# Patient Record
Sex: Male | Born: 1960 | Race: White | Hispanic: No | Marital: Single | Smoking: Current every day smoker
Health system: Southern US, Community
[De-identification: ages and names within clinical notes are randomized; demographics above are authoritative.]

## PROBLEM LIST (undated history)

## (undated) ENCOUNTER — Emergency Department (HOSPITAL_COMMUNITY): Admission: EM | Payer: Medicare Other | Source: Home / Self Care

## (undated) DIAGNOSIS — F209 Schizophrenia, unspecified: Secondary | ICD-10-CM

## (undated) DIAGNOSIS — R443 Hallucinations, unspecified: Secondary | ICD-10-CM

---

## 2017-09-06 ENCOUNTER — Encounter (HOSPITAL_COMMUNITY): Payer: Self-pay | Admitting: Emergency Medicine

## 2017-09-06 ENCOUNTER — Emergency Department (HOSPITAL_COMMUNITY)
Admission: EM | Admit: 2017-09-06 | Discharge: 2017-09-06 | Disposition: A | Discharge status: Home / Self Care | Payer: Medicare Other | Attending: Emergency Medicine | Admitting: Emergency Medicine

## 2017-09-06 ENCOUNTER — Other Ambulatory Visit: Payer: Self-pay

## 2017-09-06 ENCOUNTER — Emergency Department (HOSPITAL_COMMUNITY): Payer: Medicare Other

## 2017-09-06 DIAGNOSIS — Y9339 Activity, other involving climbing, rappelling and jumping off: Secondary | ICD-10-CM | POA: Diagnosis not present

## 2017-09-06 DIAGNOSIS — Z590 Homelessness: Secondary | ICD-10-CM | POA: Insufficient documentation

## 2017-09-06 DIAGNOSIS — W1789XA Other fall from one level to another, initial encounter: Secondary | ICD-10-CM | POA: Insufficient documentation

## 2017-09-06 DIAGNOSIS — F2089 Other schizophrenia: Secondary | ICD-10-CM | POA: Insufficient documentation

## 2017-09-06 DIAGNOSIS — Y998 Other external cause status: Secondary | ICD-10-CM | POA: Diagnosis not present

## 2017-09-06 DIAGNOSIS — F17210 Nicotine dependence, cigarettes, uncomplicated: Secondary | ICD-10-CM | POA: Insufficient documentation

## 2017-09-06 DIAGNOSIS — Z79899 Other long term (current) drug therapy: Secondary | ICD-10-CM

## 2017-09-06 DIAGNOSIS — Y929 Unspecified place or not applicable: Secondary | ICD-10-CM | POA: Diagnosis not present

## 2017-09-06 DIAGNOSIS — F22 Delusional disorders: Secondary | ICD-10-CM | POA: Diagnosis not present

## 2017-09-06 DIAGNOSIS — S32009A Unspecified fracture of unspecified lumbar vertebra, initial encounter for closed fracture: Secondary | ICD-10-CM | POA: Insufficient documentation

## 2017-09-06 DIAGNOSIS — S3992XA Unspecified injury of lower back, initial encounter: Secondary | ICD-10-CM | POA: Diagnosis present

## 2017-09-06 LAB — BASIC METABOLIC PANEL
Anion gap: 10 (ref 5–15)
BUN: 14 mg/dL (ref 6–20)
CO2: 22 mmol/L (ref 22–32)
Calcium: 8.9 mg/dL (ref 8.9–10.3)
Chloride: 103 mmol/L (ref 101–111)
Creatinine, Ser: 0.81 mg/dL (ref 0.61–1.24)
GFR calc Af Amer: >60 mL/min (ref >60)
GFR calc non Af Amer: >60 mL/min (ref >60)
Glucose, Bld: 92 mg/dL (ref 65–99)
Potassium: 4.5 mmol/L (ref 3.5–5.1)
Sodium: 135 mmol/L (ref 135–145)

## 2017-09-06 LAB — CBC
HEMATOCRIT: 43.2 % (ref 39.0–52.0)
HEMOGLOBIN: 14.5 g/dL (ref 13.0–17.0)
MCH: 30 pg (ref 26.0–34.0)
MCHC: 33.6 g/dL (ref 30.0–36.0)
MCV: 89.3 fL (ref 78.0–100.0)
Platelets: 257 10*3/uL (ref 150–400)
RBC: 4.84 MIL/uL (ref 4.22–5.81)
RDW: 14.4 % (ref 11.5–15.5)
WBC: 16.1 10*3/uL — ABNORMAL HIGH (ref 4.0–10.5)

## 2017-09-06 LAB — CBC WITH DIFFERENTIAL/PLATELET
Basophils Absolute: 0 10*3/uL (ref 0.0–0.1)
Basophils Relative: 0 %
Eosinophils Absolute: 0.1 10*3/uL (ref 0.0–0.7)
Eosinophils Relative: 0 %
HCT: 40.3 % (ref 39.0–52.0)
Hemoglobin: 13.6 g/dL (ref 13.0–17.0)
Lymphocytes Relative: 6 %
Lymphs Abs: 1.1 10*3/uL (ref 0.7–4.0)
MCH: 30.2 pg (ref 26.0–34.0)
MCHC: 33.7 g/dL (ref 30.0–36.0)
MCV: 89.4 fL (ref 78.0–100.0)
Monocytes Absolute: 1.2 10*3/uL — ABNORMAL HIGH (ref 0.1–1.0)
Monocytes Relative: 6 %
Neutro Abs: 16.7 10*3/uL — ABNORMAL HIGH (ref 1.7–7.7)
Neutrophils Relative %: 88 %
Platelets: 239 10*3/uL (ref 150–400)
RBC: 4.51 MIL/uL (ref 4.22–5.81)
RDW: 14.1 % (ref 11.5–15.5)
WBC: 19 10*3/uL — ABNORMAL HIGH (ref 4.0–10.5)

## 2017-09-06 MED ORDER — IBUPROFEN 600 MG PO TABS: 600.0000 mg | ORAL_TABLET | Freq: Four times a day (QID) | ORAL | 0 refills | Status: DC | PRN

## 2017-09-06 MED ORDER — MORPHINE SULFATE (PF) 4 MG/ML IV SOLN
4.0000 mg | Freq: Once | INTRAVENOUS | Status: AC
  Administered 2017-09-06: 4 mg via INTRAVENOUS
  Filled 2017-09-06: qty 1

## 2017-09-06 MED ORDER — OXYCODONE-ACETAMINOPHEN 5-325 MG PO TABS
1.0000 | ORAL_TABLET | Freq: Once | ORAL | Status: AC
  Administered 2017-09-06: 1 via ORAL
  Filled 2017-09-06: qty 1

## 2017-09-06 MED ORDER — OXYCODONE-ACETAMINOPHEN 5-325 MG PO TABS: 1.0000 | ORAL_TABLET | ORAL | 0 refills | Status: DC | PRN

## 2017-09-06 MED ORDER — IOPAMIDOL (ISOVUE-300) INJECTION 61%
INTRAVENOUS | Status: AC
  Administered 2017-09-06: 100 mL
  Filled 2017-09-06: qty 100

## 2017-09-06 MED ORDER — SODIUM CHLORIDE 0.9 % IV BOLUS (SEPSIS)
1000.0000 mL | Freq: Once | INTRAVENOUS | Status: AC
Start: 2017-09-06 — End: 2017-09-06
  Administered 2017-09-06: 1000 mL via INTRAVENOUS

## 2017-09-06 MED ORDER — IBUPROFEN 400 MG PO TABS: 600.0000 mg | ORAL_TABLET | Freq: Once | ORAL | Status: DC

## 2017-09-06 NOTE — ED Notes (Signed)
Patient transported to CT 

## 2017-09-06 NOTE — ED Triage Notes (Signed)
Patient states he was sleeping on a roof and was awoken and when he went to get off slipped and fell approx 15 feet to the ground below.

## 2017-09-06 NOTE — ED Triage Notes (Signed)
Reports having psych issues in the past.  Currently feeling anxious, depressed, and having delusions that he is God.  States I need some help.  Denies any SI or HI.
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
## 2017-09-06 NOTE — ED Notes (Signed)
Pt inquiring about detox. States has "nowhere to go"  Tearful.  "feels hopeless"

## 2017-09-07 ENCOUNTER — Emergency Department (HOSPITAL_COMMUNITY)
Admission: EM | Admit: 2017-09-07 | Discharge: 2017-09-07 | Disposition: A | Discharge status: Home / Self Care | Payer: Medicare Other | Source: Home / Self Care | Attending: Emergency Medicine | Admitting: Emergency Medicine

## 2017-09-07 ENCOUNTER — Encounter (HOSPITAL_COMMUNITY): Payer: Self-pay | Admitting: Emergency Medicine

## 2017-09-07 ENCOUNTER — Emergency Department (HOSPITAL_COMMUNITY)
Admission: EM | Admit: 2017-09-07 | Discharge: 2017-09-09 | Disposition: A | Discharge status: Other Acute Inpatient Hospital | Payer: Medicare Other | Source: Home / Self Care | Attending: Emergency Medicine | Admitting: Emergency Medicine

## 2017-09-07 DIAGNOSIS — F17210 Nicotine dependence, cigarettes, uncomplicated: Secondary | ICD-10-CM

## 2017-09-07 DIAGNOSIS — F22 Delusional disorders: Secondary | ICD-10-CM | POA: Insufficient documentation

## 2017-09-07 DIAGNOSIS — F2089 Other schizophrenia: Secondary | ICD-10-CM

## 2017-09-07 DIAGNOSIS — Z79899 Other long term (current) drug therapy: Secondary | ICD-10-CM | POA: Insufficient documentation

## 2017-09-07 DIAGNOSIS — Z760 Encounter for issue of repeat prescription: Secondary | ICD-10-CM

## 2017-09-07 DIAGNOSIS — S32009A Unspecified fracture of unspecified lumbar vertebra, initial encounter for closed fracture: Secondary | ICD-10-CM | POA: Diagnosis not present

## 2017-09-07 HISTORY — DX: Hallucinations, unspecified: R44.3

## 2017-09-07 HISTORY — DX: Schizophrenia, unspecified: F20.9

## 2017-09-07 LAB — COMPREHENSIVE METABOLIC PANEL
ALBUMIN: 4 g/dL (ref 3.5–5.0)
ALK PHOS: 70 U/L (ref 38–126)
ALT: 19 U/L (ref 17–63)
AST: 25 U/L (ref 15–41)
Anion gap: 11 (ref 5–15)
BILIRUBIN TOTAL: 1.1 mg/dL (ref 0.3–1.2)
BUN: 11 mg/dL (ref 6–20)
CALCIUM: 9 mg/dL (ref 8.9–10.3)
CO2: 20 mmol/L — ABNORMAL LOW (ref 22–32)
Chloride: 105 mmol/L (ref 101–111)
Creatinine, Ser: 0.8 mg/dL (ref 0.61–1.24)
GFR calc Af Amer: >60 mL/min (ref >60)
GFR calc non Af Amer: >60 mL/min (ref >60)
GLUCOSE: 121 mg/dL — AB (ref 65–99)
POTASSIUM: 3.9 mmol/L (ref 3.5–5.1)
Sodium: 136 mmol/L (ref 135–145)
TOTAL PROTEIN: 7.1 g/dL (ref 6.5–8.1)

## 2017-09-07 LAB — RAPID URINE DRUG SCREEN, HOSP PERFORMED
Amphetamines: NOT DETECTED
BARBITURATES: NOT DETECTED
BENZODIAZEPINES: NOT DETECTED
Cocaine: POSITIVE — AB
Opiates: POSITIVE — AB
Tetrahydrocannabinol: NOT DETECTED

## 2017-09-07 LAB — ETHANOL: Alcohol, Ethyl (B): <10 mg/dL (ref <10)

## 2017-09-07 MED ORDER — IBUPROFEN 600 MG PO TABS: 600.0000 mg | ORAL_TABLET | Freq: Four times a day (QID) | ORAL | 0 refills | Status: DC | PRN

## 2017-09-07 MED ORDER — LORAZEPAM 2 MG/ML IJ SOLN: 0.0000 mg | Freq: Four times a day (QID) | INTRAMUSCULAR | Status: DC

## 2017-09-07 MED ORDER — VITAMIN B-1 100 MG PO TABS
100.0000 mg | ORAL_TABLET | Freq: Every day | ORAL | Status: DC
  Administered 2017-09-07 – 2017-09-09 (×3): 100 mg via ORAL
  Filled 2017-09-07 (×3): qty 1

## 2017-09-07 MED ORDER — ALUM & MAG HYDROXIDE-SIMETH 200-200-20 MG/5ML PO SUSP: 30.0000 mL | Freq: Four times a day (QID) | ORAL | Status: DC | PRN

## 2017-09-07 MED ORDER — OLANZAPINE 7.5 MG PO TABS
7.5000 mg | ORAL_TABLET | Freq: Every day | ORAL | Status: DC
  Administered 2017-09-07: 7.5 mg via ORAL

## 2017-09-07 MED ORDER — IBUPROFEN 600 MG PO TABS
600.0000 mg | ORAL_TABLET | Freq: Four times a day (QID) | ORAL | Status: DC | PRN
  Filled 2017-09-07 (×2): qty 12

## 2017-09-07 MED ORDER — THIAMINE HCL 100 MG/ML IJ SOLN: 100.0000 mg | Freq: Every day | INTRAMUSCULAR | Status: DC

## 2017-09-07 MED ORDER — IBUPROFEN 400 MG PO TABS
600.0000 mg | ORAL_TABLET | Freq: Three times a day (TID) | ORAL | Status: DC | PRN
  Administered 2017-09-08 – 2017-09-09 (×4): 600 mg via ORAL
  Filled 2017-09-07 (×4): qty 1

## 2017-09-07 MED ORDER — OLANZAPINE 7.5 MG PO TABS: 7.5000 mg | ORAL_TABLET | Freq: Every day | ORAL | 0 refills | Status: DC

## 2017-09-07 MED ORDER — LORAZEPAM 1 MG PO TABS: 0.0000 mg | ORAL_TABLET | Freq: Two times a day (BID) | ORAL | Status: DC

## 2017-09-07 MED ORDER — IBUPROFEN 400 MG PO TABS: 600.0000 mg | ORAL_TABLET | Freq: Four times a day (QID) | ORAL | Status: DC | PRN

## 2017-09-07 MED ORDER — OLANZAPINE 7.5 MG PO TABS
7.5000 mg | ORAL_TABLET | Freq: Every day | ORAL | Status: DC
  Filled 2017-09-07: qty 1

## 2017-09-07 MED ORDER — LORAZEPAM 2 MG/ML IJ SOLN: 0.0000 mg | Freq: Two times a day (BID) | INTRAMUSCULAR | Status: DC

## 2017-09-07 MED ORDER — ONDANSETRON HCL 4 MG PO TABS: 4.0000 mg | ORAL_TABLET | Freq: Three times a day (TID) | ORAL | Status: DC | PRN

## 2017-09-07 MED ORDER — OLANZAPINE 7.5 MG PO TABS
7.5000 mg | ORAL_TABLET | Freq: Every day | ORAL | Status: DC
  Filled 2017-09-07: qty 3

## 2017-09-07 MED ORDER — LORAZEPAM 1 MG PO TABS
0.0000 mg | ORAL_TABLET | Freq: Four times a day (QID) | ORAL | Status: DC
  Administered 2017-09-07: 2 mg via ORAL
  Administered 2017-09-09: 1 mg via ORAL
  Filled 2017-09-07: qty 1
  Filled 2017-09-07: qty 2

## 2017-09-07 MED ORDER — OLANZAPINE 5 MG PO TABS
7.5000 mg | ORAL_TABLET | Freq: Every day | ORAL | Status: DC
  Administered 2017-09-07 – 2017-09-09 (×3): 7.5 mg via ORAL
  Filled 2017-09-07: qty 2
  Filled 2017-09-07: qty 1
  Filled 2017-09-07: qty 2

## 2017-09-07 MED ORDER — OLANZAPINE 7.5 MG PO TABS: 7.5000 mg | ORAL_TABLET | Freq: Every day | ORAL | Status: DC
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
## 2017-09-07 NOTE — ED Notes (Signed)
Patient able to ambulate independently  

## 2017-09-07 NOTE — ED Notes (Signed)
Patient ambulated independently multiple times to restroom and in hallways without complaint.  Patient provided with supplied medications from Pharmacy.  Patient now stating he does not want to leave and "can't I just wait in here for a while".  This RN explained plan from social work and patient verbalized understanding but still stating he does not want to leave.  When this RN went to walk patient out, patient began stating his back was spasming and he could not leave.  This RN accompanied patient in a wheelchair to lobby, [facility]  Patient stated he was unhappy with having to leave.  This Programmer, multimediaN apologized.  Will d/c.

## 2017-09-07 NOTE — Discharge Instructions (Signed)
Substance Abuse Treatment Programs ° °Intensive Outpatient Programs °[facility]     °[address]      °[place], [place]                   °[phone]      ° °[facility] °[address] °[place], [place] °336-379-7146 ° °[facility]     °(Inpatient and outpatient)     °[address]           °336-832-9800   ° °[facility] °336-288-1484 (Suboxone and Methadone) ° °[address]      °[place], [place] [address]      °336-882-2125      ° °[address] °[place], [place] °852-3033 ° °[facility] (Outpatient/Inpatient, [facility])    °(insurance only) [phone]      °       °[facility] (Groups & Residential) °[place], [place] °336-389-1413 ° °   °[facility]     °[address]     °[place], [place]      °336-389-1413      ° °[facility] (for caregivers and family) °[address] °[place], [place] °336-299-4655 ° ° ° ° ° °Residential Treatment Programs °[facility]      °[address], [place], [place] [address]  °([phone]      ° °[facility] °[address] [place], [place] [address] °919-419-1059 ° °[facility]        °336-248-8914      ° °[facility] °1-800-659-3381 ° °[facility]             °[address]                                         °[place], [place]                                                °877-615-2722 or 336-784-9470                              ° °[facility] [place] °[address] °[place], [address] °1.877.941.8954 ° °[facility]    °[address]      °, [place]     °336-273-5306      ° °[facility] °[address] °, [place] °336-285-9073 ° °[facility]   °[address]     °[place], [place] [address]     °336-899-1550      °Admissions: 8am-3pm M-F ° °[facility] °[address] °[place],  [place] °336-227-7417 ° °[facility]: Residential Program (90 Days)   °[place], [place]      °336-725-8389 or 800-758-6077    ° °[facility] °[place], [place] °(Walk in Hours over the weekend or by referral) ° °[facility] °[address], [place],  [address] °(336) [phone] ° °Crisis Mobile: [facility]:  1-877-626-1772 (for crisis response 24 hours a day) °[facility] Hotline:      1-800-256-2452 °Outpatient Psychiatry and Counseling ° °[facility]: Mobile Crisis   Management 24 hours:  1-877-626-1772 ° °[facility] sliding scale fee and walk in schedule: M-F 8am-12pm/1pm-3pm °[address]  °[place], [place] [address] °336-387-6161 ° °[facility] °[address] °[place], [place] [address] °336-703-9650 ° °[facility] ([facility])- new patient walk-in appointments available Monday - Friday 8am -3pm.          °[address] °[place], [place] [address] °336-676-6840 or crisis line- 336-676-6905 ° °[facility]/ Intensive Outpatient Therapy Program °[address] °[place], [place] [address] °336-832-9804 ° °[facility]                  °Crisis Services      °336.641.4993      °[address]     °[place], [place] [address]                ° °[facility]   °[facility] °[phone] °[address] °[place], [place] [address] ° ° °[facility]          °[address]  °[place], [place] [address]       °(336) [phone] ° °[facility] °[address] °[place], [place] [address] °336-292-1510 ° °[facility]    °[address]    °[place], [place] [address]     °336-632-3505      ° °Parish McKinney, MD     °[address]     °[place] [address]     °336-282-1251     °  °[facility] °[address] °[place] [address] ° °[facility]     °[address]     °[place], [place]      °336-542-2076      ° °[facility] °Shamsher Ahluwalia, MD °[address] °[place], [place] [address] °336-420-9558 ° °[facility]     °[address]     °[place], [place] [address]     °336-274-1237      ° °[facility] °[address] °[place], [place] [address] °336-854-4450 °Resources for Temporary Residential Assistance/Crisis Centers ° °DAY CENTERS °[facility] °M-F 8am-3pm   °[address] [place], [place] [address]   336-332-0824 °Services include: laundry, barbering, support groups, case management, phone  & computer access, showers, AA/NA mtgs, mental health/substance abuse nurse, job skills class, disability information, VA assistance, spiritual classes, etc.  ° °HOMELESS SHELTERS ° °[facility]     °[facility]   °[address], [place]     °336.271.5959       °       °[facility] (women and children)       °[address] °[place], [place] [address] °336-275-0820 °[email or web address] for application and process °Application Required ° °[facility]   °[address]    °[place] [address]     °[phone]       °             °[facility] °[address] °[place], [place] [address] °336.273.5572 °336-235-0363(schedule application appt.) °Application Required ° °[facility] (women only)    °[address]     °[place], [place] [address]     °336-884-1039      °  Intake starts 6pm daily °Need valid ID, SSC, & Police report °[facility] [place] °[address] °[place], [place] °336-881-5420 °Application Required ° °[facility] (men only)     °[address]      °[place], [place]     °[phone]      ° °[facility] °(Pregnant women only) °[address] °[place], [place] °336-275-0206 ° °[facility]      °[address]      °[place], [place] [address]     °336-722-9951      °       °[facility] °[address] °[place], [place] °336-723-1848 °90 day commitment/SA/Application process ° °[facility](men only)     °[address]     °[place], [place]     °336-748-1962       °Check-in at 7pm     °       °[facility] °[address] °[place], [place] [address] °336-248-6684 °Men/Women/Women and Children must be there by 7 pm ° °[facility] °[place], [place] °336-722-8721                ° °

## 2017-09-07 NOTE — ED Provider Notes (Signed)
[facility] EMERGENCY DEPARTMENT Provider Note   CSN: [id number] Arrival date & time: 09/06/17  2319     History   Chief Complaint Chief Complaint  Patient presents with  . Delusional  . Psychiatric Evaluation    HPI Charles Page is a 57 y.o. male.  HPI  This is a 57 year old male with a history of schizophrenia who presents requesting his medications.  Patient states that he was seen here yesterday for a fall.  He was discharged with prescriptions for medications but he is homeless and lives in [place].  He has been sitting in the waiting room.  He has no way of getting his medications.  He has been out of his olanzapine for 3 weeks.  He denies any HI, SI, hallucinations, delusions.  He denies any physical complaints.  Past Medical History:  Diagnosis Date  . Hallucination   . Schizophrenia (HCC)     There are no active problems to display for this patient.   History reviewed. No pertinent surgical history.     Home Medications    Prior to Admission medications   Medication Sig Start Date End Date Taking? Authorizing Provider  ibuprofen (ADVIL,MOTRIN) 600 MG tablet Take 1 tablet (600 mg total) by mouth every 6 (six) hours as needed. 09/06/17   Raeford Razor, MD  oxyCODONE-acetaminophen (PERCOCET/ROXICET) 5-325 MG tablet Take 1-2 tablets by mouth every 4 (four) hours as needed for severe pain. 09/06/17   Raeford Razor, MD    Family History No family history on file.  Social History Social History   Tobacco Use  . Smoking status: Current Every Day Smoker    Types: Cigarettes  . Smokeless tobacco: Never Used  Substance Use Topics  . Alcohol use: No    Frequency: Never  . Drug use: Yes    Types: Cocaine, Marijuana    Comment: used cocaine 2 days ago     Allergies   Patient has no known allergies.   Review of Systems Review of Systems  Constitutional: Negative for fever.  Respiratory: Negative for shortness of breath.     Cardiovascular: Negative for chest pain.  Psychiatric/Behavioral: Negative for hallucinations and suicidal ideas. The patient is not hyperactive.   All other systems reviewed and are negative.    Physical Exam Updated Vital Signs BP 139/88 (BP Location: Right Arm)   Pulse 100   Temp 98.8 F (37.1 C) (Oral)   Resp 18   Ht 5\' 11"  (1.803 m)   Wt 71.2 kg (157 lb)   SpO2 92%   BMI 21.90 kg/m   Physical Exam  Constitutional: He is oriented to person, place, and time.  Disheveled appearing, no acute distress  HENT:  Head: Normocephalic and atraumatic.  Cardiovascular: Normal rate and regular rhythm.  Pulmonary/Chest: Effort normal. No respiratory distress.  Musculoskeletal: He exhibits no edema.  Neurological: He is alert and oriented to person, place, and time.  Skin: Skin is warm and dry.  Psychiatric:  Pleasant but paranoid  Nursing note and vitals reviewed.    ED Treatments / Results  Labs (all labs ordered are listed, but only abnormal results are displayed) Labs Reviewed  COMPREHENSIVE METABOLIC PANEL - Abnormal; Notable for the following components:      Result Value   CO2 20 (*)    Glucose, Bld 121 (*)    All other components within normal limits  CBC - Abnormal; Notable for the following components:   WBC 16.1 (*)    All  other components within normal limits  RAPID URINE DRUG SCREEN, HOSP PERFORMED - Abnormal; Notable for the following components:   Opiates POSITIVE (*)    Cocaine POSITIVE (*)    All other components within normal limits  ETHANOL    EKG  EKG Interpretation None       Radiology Ct Abdomen Pelvis W Contrast  Result Date: 09/06/2017 CLINICAL DATA:  15 feet fall EXAM: CT ABDOMEN AND PELVIS WITH CONTRAST TECHNIQUE: Multidetector CT imaging of the abdomen and pelvis was performed using the standard protocol following bolus administration of intravenous contrast. CONTRAST:  100mL ISOVUE-300 IOPAMIDOL (ISOVUE-300) INJECTION 61% COMPARISON:   None. FINDINGS: Lower chest: Lung bases demonstrate moderate emphysema. Patchy consolidation at the medial posterior right lung base. No pleural effusion or pneumothorax at the lung base. Heart size within normal limits. Hepatobiliary: No focal hepatic abnormality. No calcified gallstones or biliary dilatation. Pancreas: Unremarkable. No pancreatic ductal dilatation or surrounding inflammatory changes. Spleen: No splenic injury or perisplenic hematoma. Adrenals/Urinary Tract: No adrenal hemorrhage or renal injury identified. Bladder is unremarkable. Stomach/Bowel: Stomach is within normal limits. Appendix appears normal. No evidence of bowel wall thickening, distention, or inflammatory changes. Vascular/Lymphatic: Nonaneurysmal aorta. Mild to moderate aortic atherosclerosis. No significantly enlarged lymph nodes. Reproductive: Slightly enlarged prostate with calcification Other: Negative for free air or significant free fluid. Musculoskeletal: Grade 1 anterolisthesis of L5 on S1. Vertebral body heights are within normal limits. Possible subtle right transverse process fracture at L3. IMPRESSION: 1. No CT evidence for acute solid organ injury within the abdomen or pelvis 2. Patchy consolidation in the medial posterior right lung base may reflect atelectasis, aspiration or minimal contusion 3. Possible nondisplaced right transverse process fracture at L3 Electronically Signed   By: Jasmine PangKim  Fujinaga M.D.   On: 09/06/2017 19:48    Procedures Procedures (including critical care time)  Medications Ordered in ED Medications - No data to display   Initial Impression / Assessment and Plan / ED Course  I have reviewed the triage vital signs and the nursing notes.  Pertinent labs & imaging results that were available during my care of the patient were reviewed by me and considered in my medical decision making (see chart for details).    Patient presents requesting help getting his medications.  He is homeless.   He was discharged to the waiting room but signed back and.  I feel he will likely do the same if I discharge him without help with medications and transport.  Will have case management evaluate him in the morning.  Final Clinical Impressions(s) / ED Diagnoses   Final diagnoses:  Other schizophrenia [facility](HCC)  Medication refill    ED Discharge Orders    None       Shon BatonHorton, Courtney F, MD 09/07/17 [phone]

## 2017-09-07 NOTE — ED Notes (Signed)
Discussed plan of care with patient including medications and discharge plans. Patient agreeable.

## 2017-09-07 NOTE — Progress Notes (Signed)
CSW met with patient at bedside to discuss issues with discharge. Patient is homeless, needs psychotropic medications, and lacks support system. CSW and patient discussed current issues. CSW called to various shelters in attempt to secure a place for the night, [employer] is the most likely option and was told by Best boy to call back after 3:30pm to determine whether or not he would have a place. CSW gave information for [facility], shelters, and [employer], [place] bus route information, and gave patient two bus passes. Caryl Pina, RN CM is helping patient with his medication. Patient in obvious need of medication for his mental health.  Madilyn Fireman, MSW, LCSW-A Weekend Clinical Social Worker [phone]

## 2017-09-07 NOTE — ED Triage Notes (Signed)
Pt returning to ER due to be delusional. Was in the waiting area stating he was jesus. Pt not clear on why he is here.

## 2017-09-07 NOTE — Care Management Note (Addendum)
Case Management Note  Patient Details  Name: Charles Page MRN: [id number] Date of Birth: 01/05/1961  Subjective/Objective:      Consult received bc pt had visit to ED yesterday and returned today because he had nowhere to go and could not get prescriptions filled for Zyprexa, Advil and Percocet.  Pt is schizophrenic and has Medicaid and Medicare A/B.  Pt states his daughter, Charles Page, usually helps him with prescriptions but he cannot get in touch with her and she has no transportation.  Pt states he lives in a shed in [place] but would like to go to a shelter.  He has no money to get prescriptions filled.  Advil and Percocet are for back pain, which was his complaint yesterday.  Pt is ambulating without problem in room and does not appear to be in a great deal of pain.  Pt states he can walk to a bus stop if given bus passes.  Action/Plan: Pt supplied phone number that he thinks is daughter's.  Left VM for daughter.  Pt also had a business card for Charles Page, Peer support from mental health facility ([facility]) in [place].  Left VM on cell phone for Charles Page.    Consulted with CSW, Charles Page, who arranged for bus passes and for pt to go to shelter.  Pt provided 3 [facility] of [facility] and [facility].  ED staff to pick up from pharmacy.            Expected Discharge Date:   09/07/17               Expected Discharge Plan:    to shelter  In-House Referral:    CSW  Discharge planning Services   CM- medication management  Post Acute Care Choice:   N/A Choice offered to:     DME Arranged:    DME Agency:     HH Arranged:    HH Agency:     Status of Service:     If discussed at [employer] Meetings, dates discussed:    Additional Comments:  Addendum: Charles Page from [facility] returned call and said patient had come there once and not returned.  Charles Page is willing to help patient with housing and medications if pt can return there.  Charles Page [facility].    Charles FurlongAshley  Charles Lansdale, RN 09/07/2017, 10:14 AM

## 2017-09-07 NOTE — ED Notes (Addendum)
Pt rambling about family problems, being homeless, not having psych meds, not having a place to sleep, not being able to sleep, not having clothes. Pt denies SI/HI. Pt asking about auditory or visual hallucinations and denies. Pt answers questions after being asked questions multiple times. Pt reports hx of traumatic head injury. Pt requesting to be in the "psych ward" so he can get back on his meds.

## 2017-09-07 NOTE — ED Notes (Signed)
Pt had lab work completed yesterday.

## 2017-09-07 NOTE — ED Provider Notes (Signed)
Blood pressure 111/67, pulse 76, temperature 98.8 F (37.1 C), temperature source Oral, resp. rate 16, height 5\' 11"  (1.803 m), weight 71.2 kg (157 lb), SpO2 96 %.  Assuming care from Dr. Wilkie AyeHorton.  In short, Parthenia Ameserry Storer is a 57 y.o. male with a chief complaint of Delusional and Psychiatric Evaluation .  Refer to the original H&P for additional details.  The current plan of care is to f/u with social work for med assistance.  09:54 AM Social work has evaluated the patient.  I will provide prescriptions for the patient's Zyprexa 7.5 mg daily along with Advil.  I also provided resource information for behavioral health to pursue as an outpatient.  At this time, I do not feel there is any life-threatening condition present. I have reviewed and discussed all results (EKG, imaging, lab, urine as appropriate), exam findings with patient. I have reviewed nursing notes and appropriate previous records.  I feel the patient is safe to be discharged home without further emergent workup. Discussed usual and customary return precautions. Patient and family (if present) verbalize understanding and are comfortable with this plan.  Patient will follow-up with their primary care provider. If they do not have a primary care provider, information for follow-up has been provided to them. All questions have been answered.  Alona BeneJoshua Doloris Servantes, MD     Maia PlanLong, Rayelle Armor G, MD 09/07/17 [phone]

## 2017-09-07 NOTE — ED Notes (Signed)
Pt taking shower at this time  

## 2017-09-07 NOTE — ED Provider Notes (Signed)
[facility] Provider Note   CSN: [id number] Arrival date & time: 09/07/17  1613     History   Chief Complaint Chief Complaint  Patient presents with  . Delusional    HPI Parthenia Ameserry Thomann is a 57 y.o. male.  HPI  Patient with a history of hallucinations and schizophrenia presents after recent discharge earlier today saying he is Jesus.  When asked what happened after he left the ED he states he walked around and then states he is not sure why he is here.  In the waiting room he was stating that he thought he was Jesus.  Prior to discharge today he was seen by social work and provided with 3 days worth of his medications as well as prescriptions and bus passes and plan for shelter tonight.    Past Medical History:  Diagnosis Date  . Hallucination   . Schizophrenia (HCC)     There are no active problems to display for this patient.   History reviewed. No pertinent surgical history.     Home Medications    Prior to Admission medications   Medication Sig Start Date End Date Taking? Authorizing Provider  ibuprofen (ADVIL,MOTRIN) 600 MG tablet Take 1 tablet (600 mg total) by mouth every 6 (six) hours as needed for up to 3 days. 09/07/17 09/10/17 Yes Long, Arlyss RepressJoshua G, MD  OLANZapine (ZYPREXA) 7.5 MG tablet Take 1 tablet (7.5 mg total) by mouth daily for 3 days. 09/07/17 09/10/17  Long, Arlyss RepressJoshua G, MD  OLANZapine (ZYPREXA) 7.5 MG tablet Take 1 tablet (7.5 mg total) by mouth daily. 09/07/17 10/07/17  Long, Arlyss RepressJoshua G, MD  oxyCODONE-acetaminophen (PERCOCET/ROXICET) 5-325 MG tablet Take 1-2 tablets by mouth every 4 (four) hours as needed for severe pain. Patient not taking: Reported on 09/07/2017 09/06/17   Raeford RazorKohut, Stephen, MD    Family History History reviewed. No pertinent family history.  Social History Social History   Tobacco Use  . Smoking status: Current Every Day Smoker    Types: Cigarettes  . Smokeless tobacco: Never Used  Substance Use Topics   . Alcohol use: No    Frequency: Never  . Drug use: Yes    Types: Cocaine, Marijuana    Comment: used cocaine 2 days ago     Allergies   Patient has no known allergies.   Review of Systems Review of Systems  ROS reviewed and all otherwise negative except for mentioned in HPI   Physical Exam Updated Vital Signs BP 112/60   Pulse 67   Temp 97.8 F (36.6 C) (Oral)   Resp 16   SpO2 99%  Vitals reviewed Physical Exam  Physical Examination: General appearance - alert, disheveled appearing, and in no distress Mental status - alert, oriented to person, Eyes - pupils equal and reactive, extraocular eye movements intact Mouth - mucous membranes moist, pharynx normal without lesions Chest - clear to auscultation, no wheezes, rales or rhonchi, symmetric air entry Heart - normal rate, regular rhythm, normal S1, S2, no murmurs, rubs, clicks or gallops Abdomen - soft, nontender, nondistended, no masses or organomegaly Neurological - alert, oriented, normal speech, strength 5/5 in extremities x 4, normal gait Extremities - peripheral pulses normal, no pedal edema, no clubbing or cyanosis Skin - normal coloration and turgor, no rashes,  Psych- delusions, calm and cooperative   ED Treatments / Results  Labs (all labs ordered are listed, but only abnormal results are displayed) Labs Reviewed - No data to display  EKG  EKG Interpretation None       Radiology Ct Abdomen Pelvis W Contrast  Result Date: 09/06/2017 CLINICAL DATA:  15 feet fall EXAM: CT ABDOMEN AND PELVIS WITH CONTRAST TECHNIQUE: Multidetector CT imaging of the abdomen and pelvis was performed using the standard protocol following bolus administration of intravenous contrast. CONTRAST:  ISOVUE-300 IOPAMIDOL (ISOVUE-300) INJECTION 61% COMPARISON:  None. FINDINGS: Lower chest: Lung bases demonstrate moderate emphysema. Patchy consolidation at the medial posterior right lung base. No pleural effusion or pneumothorax  at the lung base. Heart size within normal limits. Hepatobiliary: No focal hepatic abnormality. No calcified gallstones or biliary dilatation. Pancreas: Unremarkable. No pancreatic ductal dilatation or surrounding inflammatory changes. Spleen: No splenic injury or perisplenic hematoma. Adrenals/Urinary Tract: No adrenal hemorrhage or renal injury identified. Bladder is unremarkable. Stomach/Bowel: Stomach is within normal limits. Appendix appears normal. No evidence of bowel wall thickening, distention, or inflammatory changes. Vascular/Lymphatic: Nonaneurysmal aorta. Mild to moderate aortic atherosclerosis. No significantly enlarged lymph nodes. Reproductive: Slightly enlarged prostate with calcification Other: Negative for free air or significant free fluid. Musculoskeletal: Grade 1 anterolisthesis of L5 on S1. Vertebral body heights are within normal limits. Possible subtle right transverse process fracture at L3. IMPRESSION: 1. No CT evidence for acute solid organ injury within the abdomen or pelvis 2. Patchy consolidation in the medial posterior right lung base may reflect atelectasis, aspiration or minimal contusion 3. Possible nondisplaced right transverse process fracture at L3 Electronically Signed   By: Jasmine Pang M.D.   On: 09/06/2017 19:48    Procedures Procedures (including critical care time)  Medications Ordered in ED Medications - No data to display   Initial Impression / Assessment and Plan / ED Course  I have reviewed the triage vital signs and the nursing notes.  Pertinent labs & imaging results that were available during my care of the patient were reviewed by me and considered in my medical decision making (see chart for details).    8:55 PM  D/w TTS- [facility]- will have a bed available tomorrow.  Psych holding orders placed.    Pt is medically cleared based on labs obtained lat night at 11pm  Final Clinical Impressions(s) / ED Diagnoses   Final  diagnoses:  Delusion [facility])    ED Discharge Orders    None       Phillis Haggis, MD 09/07/17 2104

## 2017-09-07 NOTE — ED Notes (Signed)
Patient on TTS with Ala DachFord from [facility]

## 2017-09-08 DIAGNOSIS — S32009A Unspecified fracture of unspecified lumbar vertebra, initial encounter for closed fracture: Secondary | ICD-10-CM | POA: Diagnosis not present

## 2017-09-08 NOTE — Care Management (Signed)
ED CM met with patient yesterday upon discharge. Patient was discharged with 3 days of medications and to follow-up at [facility] with Mary Anne Placey NP patient verbalized understanding and teach back was done. CM spoke with Mary Anne at the [facility] concerning [facility] medication management program. Patient can follow up with [facility] located at [address] [place]  Mon-Fri 8-2pm [phone] once cleared for discharge. Updated Taylor RN on Pod F. No further ED CM needs identified. 

## 2017-09-08 NOTE — ED Notes (Signed)
Lunch tray ordered 

## 2017-09-08 NOTE — ED Notes (Signed)
Pt provided with snack

## 2017-09-08 NOTE — BH Assessment (Signed)
[facility] Assessment Progress Note Reassessment Pt reports he slept well last night and ate well this AM. Pt reports "I know I am not Jesus and that is part of my illness". Denies SI, HI, and AH/VH. Pt reports he is still "swimy headed" from medication and reports he is still having racing thoughts. Pt reports he still needs inpatient services to get and stay stable. He reports if he leaves he will "just end up right back here and wanting to harm himself".

## 2017-09-09 ENCOUNTER — Encounter (HOSPITAL_COMMUNITY): Payer: Self-pay

## 2017-09-09 ENCOUNTER — Other Ambulatory Visit: Payer: Self-pay

## 2017-09-09 ENCOUNTER — Inpatient Hospital Stay (HOSPITAL_COMMUNITY)
Admission: AD | Admit: 2017-09-09 | Discharge: 2017-09-12 | DRG: 885 | Disposition: A | Discharge status: Home / Self Care | Payer: Medicare Other | Source: Intra-hospital | Attending: Psychiatry | Admitting: Psychiatry

## 2017-09-09 DIAGNOSIS — G4700 Insomnia, unspecified: Secondary | ICD-10-CM | POA: Diagnosis present

## 2017-09-09 DIAGNOSIS — S32009A Unspecified fracture of unspecified lumbar vertebra, initial encounter for closed fracture: Secondary | ICD-10-CM | POA: Diagnosis not present

## 2017-09-09 DIAGNOSIS — F419 Anxiety disorder, unspecified: Secondary | ICD-10-CM | POA: Diagnosis not present

## 2017-09-09 DIAGNOSIS — F329 Major depressive disorder, single episode, unspecified: Secondary | ICD-10-CM | POA: Diagnosis present

## 2017-09-09 DIAGNOSIS — F411 Generalized anxiety disorder: Secondary | ICD-10-CM | POA: Diagnosis present

## 2017-09-09 DIAGNOSIS — Z590 Homelessness: Secondary | ICD-10-CM | POA: Diagnosis not present

## 2017-09-09 DIAGNOSIS — F209 Schizophrenia, unspecified: Principal | ICD-10-CM | POA: Diagnosis present

## 2017-09-09 DIAGNOSIS — F1490 Cocaine use, unspecified, uncomplicated: Secondary | ICD-10-CM | POA: Diagnosis not present

## 2017-09-09 DIAGNOSIS — R451 Restlessness and agitation: Secondary | ICD-10-CM | POA: Diagnosis not present

## 2017-09-09 DIAGNOSIS — F1290 Cannabis use, unspecified, uncomplicated: Secondary | ICD-10-CM | POA: Diagnosis not present

## 2017-09-09 DIAGNOSIS — F17210 Nicotine dependence, cigarettes, uncomplicated: Secondary | ICD-10-CM | POA: Diagnosis present

## 2017-09-09 MED ORDER — ZIPRASIDONE MESYLATE 20 MG IM SOLR: 20.0000 mg | INTRAMUSCULAR | Status: DC | PRN

## 2017-09-09 MED ORDER — RISPERIDONE 2 MG PO TBDP: 2.0000 mg | ORAL_TABLET | Freq: Three times a day (TID) | ORAL | Status: DC | PRN

## 2017-09-09 MED ORDER — LORAZEPAM 1 MG PO TABS: 1.0000 mg | ORAL_TABLET | ORAL | Status: DC | PRN

## 2017-09-09 MED ORDER — HYDROXYZINE HCL 25 MG PO TABS
25.0000 mg | ORAL_TABLET | Freq: Four times a day (QID) | ORAL | Status: DC | PRN
  Administered 2017-09-10 – 2017-09-11 (×3): 25 mg via ORAL
  Filled 2017-09-09 (×2): qty 1

## 2017-09-09 MED ORDER — MAGNESIUM HYDROXIDE 400 MG/5ML PO SUSP: 30.0000 mL | Freq: Every day | ORAL | Status: DC | PRN

## 2017-09-09 MED ORDER — ACETAMINOPHEN 325 MG PO TABS
650.0000 mg | ORAL_TABLET | Freq: Four times a day (QID) | ORAL | Status: DC | PRN
  Administered 2017-09-10 – 2017-09-11 (×3): 650 mg via ORAL
  Filled 2017-09-09 (×4): qty 2

## 2017-09-09 MED ORDER — TRAZODONE HCL 100 MG PO TABS
100.0000 mg | ORAL_TABLET | Freq: Every evening | ORAL | Status: DC | PRN
  Administered 2017-09-09 – 2017-09-11 (×3): 100 mg via ORAL
  Filled 2017-09-09 (×12): qty 1

## 2017-09-09 MED ORDER — ALUM & MAG HYDROXIDE-SIMETH 200-200-20 MG/5ML PO SUSP: 30.0000 mL | ORAL | Status: DC | PRN

## 2017-09-09 MED ORDER — HYDROXYZINE HCL 25 MG PO TABS
ORAL_TABLET | ORAL | Status: AC
  Filled 2017-09-09: qty 1

## 2017-09-09 MED ORDER — OLANZAPINE 5 MG PO TABS
5.0000 mg | ORAL_TABLET | Freq: Every day | ORAL | Status: DC
  Administered 2017-09-09: 5 mg via ORAL
  Filled 2017-09-09 (×3): qty 1
  Filled 2017-09-09: qty 2

## 2017-09-09 MED ORDER — IBUPROFEN 600 MG PO TABS
600.0000 mg | ORAL_TABLET | Freq: Four times a day (QID) | ORAL | Status: DC | PRN
  Administered 2017-09-09 – 2017-09-12 (×7): 600 mg via ORAL
  Filled 2017-09-09 (×7): qty 1

## 2017-09-09 MED ORDER — NICOTINE 21 MG/24HR TD PT24
21.0000 mg | MEDICATED_PATCH | Freq: Every day | TRANSDERMAL | Status: DC
  Administered 2017-09-10: 21 mg via TRANSDERMAL
  Filled 2017-09-09 (×6): qty 1

## 2017-09-09 NOTE — ED Notes (Signed)
PT came out of room and used phone; reports feeling better but still having some symptoms; RN offered ativan. He said "I will try it".

## 2017-09-09 NOTE — ED Notes (Signed)
Pt asking repetitive questions - "When am I going to Behavioral Health?" Advised pt when they notify bed is ready. Voices understanding then calls RN to room again.

## 2017-09-09 NOTE — Progress Notes (Signed)
CSW received call from pt's nurse, Josie DixonElizabeth P, RN, who says pt wants to leave and has told her he's called his lawyer.  Pt was re-assessed yesterday evening and continues to meet criteria for inpatient treatment.  [facility] has reviewed him and is offering a bed as soon as a patient is d/c'd from the 500 hall on the Adult Unit.    CSW called back and spoke with Kriste BasqueBecky, RN, advised that EDP should be consulted about IVCing the patient.  Timmothy EulerJean T. Kaylyn LimSutter, MSW, LCSWA Disposition Clinical Social Work [phone] (cell) [phone] (office)

## 2017-09-09 NOTE — ED Notes (Signed)
Pt has come out of room to make phone calls; cooperative; calm; warm compress given to apply to lower back- complaints of muscle strain.

## 2017-09-09 NOTE — ED Notes (Signed)
Pt requesting to take a shower now. RN informed the pt that he may take a shower later in the morning.

## 2017-09-09 NOTE — ED Notes (Signed)
Patient on telephone with "friend" Jola BabinskiMarilyn [phone] who said she would come get him. She lives in [place]. He also states he has an atty: Olene Flossustin Schoch at [phone].  He is wanting to leave.

## 2017-09-09 NOTE — Progress Notes (Signed)
Did not attend group 

## 2017-09-09 NOTE — Tx Team (Signed)
Initial Treatment Plan 09/09/2017 8:20 PM Charles Page ZOX:[id number]    PATIENT STRESSORS: Financial difficulties Medication change or noncompliance   PATIENT STRENGTHS: Communication skills General fund of knowledge Motivation for treatment/growth Physical Health Work skills   PATIENT IDENTIFIED PROBLEMS: Psychosis  "Be more at ease with my mental illness"  "I want the rapid thoughts to go away"                 DISCHARGE CRITERIA:  Improved stabilization in mood, thinking, and/or behavior Verbal commitment to aftercare and medication compliance  PRELIMINARY DISCHARGE PLAN: Outpatient therapy Medication management  PATIENT/FAMILY INVOLVEMENT: This treatment plan has been presented to and reviewed with the patient, Charles Page.  The patient and family have been given the opportunity to ask questions and make suggestions.  Levin BaconHeather V Elijan Googe, RN 09/09/2017, 8:20 PM

## 2017-09-09 NOTE — ED Notes (Signed)
Regular Diet was ordered for Lunch. 

## 2017-09-09 NOTE — ED Notes (Signed)
Pt resting; sitter now at bedside.

## 2017-09-09 NOTE — ED Notes (Signed)
Pt noted w/confusion. States he does not know why he is here. Advised pt - pt then stated he remembers. Pt asking for his "Case Worker". Advised pt he does not have a case worker - voiced understanding. Ad[facility] pt [facility] and will be transported there later today. Voiced agreement and understanding. Pt states he continues to hear voices and "tick tock" noises.

## 2017-09-09 NOTE — ED Notes (Signed)
Pt signed consent forms - [facility], copy sent to Medical Records, and original [facility]. Pt continues to c/o "tick tock" noises. Offered for pt to turn on tv - declined. Offered for pt to read magazines - declined.

## 2017-09-09 NOTE — Progress Notes (Signed)
Charles Page is a 57 year old male being admitted voluntarily to [facility] from [facility].  He came to the ED multiple times and last was c/o delusions and hearing voices.  [facility], he was irritable but easily redirected.  He denies SI/HI or A/V hallucinations at this time.  He denies any delusions.  He stated that he just needed to get back on his medications.  He denies any pain at this time.  Oriented him to the unit.  Admission paperwork completed and signed.  Belongings searched and secured in locker # 45, no contraband found.  Skin assessment completed and noticed abrasion to upper back and left ankle.  No redness or drainage noted.  Q 15 minute checks initiated for safety.  We will continue to monitor the progress towards his goals.

## 2017-09-09 NOTE — ED Notes (Signed)
Meal Tray ordered.  

## 2017-09-09 NOTE — ED Notes (Signed)
Pt ambulated to restroom. Pt back in bed resting at this time, pt is calm and cooperative.

## 2017-09-09 NOTE — ED Notes (Signed)
Pt ate lunch.

## 2017-09-09 NOTE — ED Notes (Signed)
TTS note states pt is SI so sitter is indicated and ordered.

## 2017-09-09 NOTE — ED Notes (Signed)
RN called BH and spoke to UnionJean. EDP needs to reassess. BH last recommendation is inpatient.

## 2017-09-09 NOTE — ED Notes (Signed)
Pt noted to be pacing in room

## 2017-09-09 NOTE — ED Notes (Signed)
Pt aware [facility] has a bed for him. Pt voices agreement w/tx plan. Pt calling calling his friend, Jola BabinskiMarilyn, to advise.

## 2017-09-09 NOTE — ED Notes (Signed)
Gathered all things for pt to take a shower and escort to shower with a sitter.

## 2017-09-10 DIAGNOSIS — F209 Schizophrenia, unspecified: Secondary | ICD-10-CM

## 2017-09-10 MED ORDER — OLANZAPINE 10 MG PO TABS
10.0000 mg | ORAL_TABLET | Freq: Every day | ORAL | Status: DC
  Administered 2017-09-10 – 2017-09-11 (×2): 10 mg via ORAL
  Filled 2017-09-10 (×5): qty 1

## 2017-09-10 NOTE — Progress Notes (Signed)
Recreation Therapy Notes  Date: 09/10/17 Time: 1000 Location: 500 Hall Dayroom  Group Topic: Coping Skills  Goal Area(s) Addresses:  Patient will be able to identify positive coping skills. Patient will be able to identify benefits of using coping skills post d/c.  Behavioral Response: Engaged  Intervention: Worksheet, pencils   Activity: OrthoptistWeb Design.  Patients were to write the things that have had them stuck or stressed inside of the web.  Patients were to then come up with at least three coping skills and write them outside of the web.  Education: PharmacologistCoping Skills, Building control surveyorDischarge Planning.   Education Outcome: Acknowledges understanding/In group clarification offered/Needs additional education.   Clinical Observations/Feedback: Pt came late to group.  Pt stated he has been dealing with drugs, lateness and depression.  Pt identified some of his coping skills as prayer, give self reminders for appointments, volunteer, fishing and shooting pool.  Pt also revealed he has a had time accepting compliments from people because he doesn't believe them and because of his lack of self esteem.    Caroll RancherMarjette Jettie Page, LRT/CTRS      Caroll RancherLindsay, Haniah Penny A 09/10/2017 11:55 AM

## 2017-09-10 NOTE — Progress Notes (Signed)
Recreation Therapy Notes  INPATIENT RECREATION THERAPY ASSESSMENT  Patient Details Name: Charles Page MRN: [id number] DOB: 04/16/1961 Today's Date: 09/10/2017       Information Obtained From: Patient  Able to Participate in Assessment/Interview: Yes  Patient Presentation: Alert, Oriented  Reason for Admission (Per Patient): Med Non-Compliance  Patient Stressors: Other (Comment)(Staying clean)  Coping Skills:   Isolation, Journal, TV, Arguments, Music, Exercise, Meditate, Substance Abuse, Talk, Art, Prayer, Avoidance, Read, Hot Bath/Shower  Leisure Interests (2+):  Individual - Other (Comment), Sports - Other (Comment)(Watch videos, shooting pool)  Frequency of Recreation/Participation: Weekly  Awareness of Community Resources:  Yes  Community Resources:  Other (Comment)(Shelter)  Current Use: Yes  Expressed Interest in State Street CorporationCommunity Resource Information: Yes  Patient Main Form of Transportation: Walk  Patient Strengths:  Motivated, Determined, Positive attitude over laziness  Patient Identified Areas of Improvement:  Worrying, Laziness  Current Recreation Participation:  "At least 2 times a week"  Patient Goal for Hospitalization:  "Know I'm a lot stronger than I think I am and have a more positive attitude"  [place]  [place]  Current [place] (including self-harm):  No  Current HI:  No  Current AVH: No  Staff Intervention Plan: Group Attendance  Consent to Intern Participation: N/A   Caroll RancherMarjette Grasiela Jonsson, LRT/CTRS  Lillia AbedLindsay, Janney Priego A 09/10/2017, 1:31 PM

## 2017-09-10 NOTE — Plan of Care (Signed)
Nurse discussed depression, anxiety, coping skills with patient.  
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
## 2017-09-10 NOTE — Progress Notes (Signed)
D:  Patient denied SI and HI, contracts for safety.  Denied A/V hallucinations.   A:  Medications administered per MD orders.  Emotional support and encouragement give patient. R:  Safety maintained with 15 minute checks. Patient stated his mother warned him about drugs and he wished that he had listened to her.  Patient will discuss his medications with MD today.

## 2017-09-10 NOTE — [facility] Group Notes (Signed)
[facility] LCSW Group Therapy Note  Date/Time: 09/10/17, 1315  Type of Therapy and Topic:  Group Therapy:  Overcoming Obstacles  Participation Level: active   Description of Group:    In this group patients will be encouraged to explore what they see as obstacles to their own wellness and recovery. They will be guided to discuss their thoughts, feelings, and behaviors related to these obstacles. The group will process together ways to cope with barriers, with attention given to specific choices patients can make. Each patient will be challenged to identify changes they are motivated to make in order to overcome their obstacles. This group will be process-oriented, with patients participating in exploration of their own experiences as well as giving and receiving support and challenge from other group members.  Therapeutic Goals: 1. Patient will identify personal and current obstacles as they relate to admission. 2. Patient will identify barriers that currently interfere with their wellness or overcoming obstacles.  3. Patient will identify feelings, thought process and behaviors related to these barriers. 4. Patient will identify two changes they are willing to make to overcome these obstacles:    Summary of Patient Progress: Pt shared that substances and negative friends are the major obstacles in his life currently.  Pt active in group discussion about taking steps to overcome obstacles and also on keeping a positive mindset while facing obstacles in life.       Therapeutic Modalities:   Cognitive Behavioral Therapy Solution Focused Therapy Motivational Interviewing Relapse Prevention Therapy  Daleen SquibbGreg Keasia Dubose, LCSW
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
## 2017-09-10 NOTE — Progress Notes (Signed)
Patient ID: Charles Page, male   DOB: 10/10/1960, 57 y.o.   MRN: [id number] PER STATE REGULATIONS 482.30  THIS CHART WAS REVIEWED FOR MEDICAL NECESSITY WITH RESPECT TO THE PATIENT'S ADMISSION/DURATION OF STAY.  NEXT REVIEW DATE:09/13/17  Loura HaltBARBARA Itzayanna Kaster, RN, BSN CASE MANAGER

## 2017-09-10 NOTE — Progress Notes (Signed)
Adult Psychoeducational Group Note  Date:  09/10/2017 Time:  4:48 PM  Group Topic/Focus:  Developing a Wellness Toolbox:   The focus of this group is to help patients develop a "wellness toolbox" with skills and strategies to promote recovery upon discharge.  Participation Level:  Active  Participation Quality:  Appropriate and Attentive  Affect:  Appropriate  Cognitive:  Alert and Appropriate  Insight: Appropriate and Good  Engagement in Group:  Engaged  Modes of Intervention:  Discussion and Education  Additional Comments:  Topic was on personal development.  Patient was attentive and participated.  Charles Page Charles Page 09/10/2017, 4:48 PM

## 2017-09-10 NOTE — Progress Notes (Addendum)
  DATA ACTION RESPONSE  Objective- Pt. is visible in the hallway, seen pacing. Presents with an anxious/paranoid affect and mood. Pt easily gets irritable when requests are denied. Tangential in thought process. Subjective- Denies having any SI/HI/AVH at this time. Rates pain 8/10; generalized.Is cooperative and remains safe on the unit.  1:1 interaction in private to establish rapport. Encouragement, education, & support given from staff.  PRN ibuprofen and vistaril requested and will re-eval accordingly.   Safety maintained with Q 15 checks. Continue with POC.
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
## 2017-09-11 DIAGNOSIS — G4700 Insomnia, unspecified: Secondary | ICD-10-CM

## 2017-09-11 DIAGNOSIS — F419 Anxiety disorder, unspecified: Secondary | ICD-10-CM

## 2017-09-11 DIAGNOSIS — F1290 Cannabis use, unspecified, uncomplicated: Secondary | ICD-10-CM

## 2017-09-11 DIAGNOSIS — F1490 Cocaine use, unspecified, uncomplicated: Secondary | ICD-10-CM

## 2017-09-11 DIAGNOSIS — Z590 Homelessness: Secondary | ICD-10-CM

## 2017-09-11 DIAGNOSIS — F209 Schizophrenia, unspecified: Principal | ICD-10-CM

## 2017-09-11 DIAGNOSIS — F329 Major depressive disorder, single episode, unspecified: Secondary | ICD-10-CM

## 2017-09-11 DIAGNOSIS — F17210 Nicotine dependence, cigarettes, uncomplicated: Secondary | ICD-10-CM

## 2017-09-11 DIAGNOSIS — R451 Restlessness and agitation: Secondary | ICD-10-CM

## 2017-09-11 LAB — LIPID PANEL
CHOL/HDL RATIO: 3.5 ratio
CHOLESTEROL: 157 mg/dL (ref 0–200)
HDL: 45 mg/dL (ref >40)
LDL Cholesterol: 100 mg/dL — ABNORMAL HIGH (ref 0–99)
Triglycerides: 58 mg/dL (ref <150)
VLDL: 12 mg/dL (ref 0–40)

## 2017-09-11 LAB — HEMOGLOBIN A1C
HEMOGLOBIN A1C: 5.5 % (ref 4.8–5.6)
MEAN PLASMA GLUCOSE: 111.15 mg/dL

## 2017-09-11 LAB — TSH: TSH: 4.556 u[IU]/mL — AB (ref 0.350–4.500)

## 2017-09-11 NOTE — Progress Notes (Signed)
Adult Psychoeducational Group Note  Date:  09/11/2017 Time:  8:30 PM  Group Topic/Focus:  Wrap-Up Group:   The focus of this group is to help patients review their daily goal of treatment and discuss progress on daily workbooks.  Participation Level:  Active  Participation Quality:  Appropriate  Affect:  Appropriate  Cognitive:  Appropriate  Insight: Appropriate  Engagement in Group:  Engaged  Modes of Intervention:  Discussion  Additional Comments: The patient expressed that he attended groups and rates his day a 9.  Octavio Mannshigpen, Tylia Ewell Lee 09/11/2017, 8:30 PM

## 2017-09-11 NOTE — Progress Notes (Signed)
DATA ACTION RESPONSE  Objective- Pt. is visible in the room, seen resting in bed with eyes open. Presents with an anxious/paranoid affect and mood. Pt easily gets irritable when requests are denied. Tangential in thought process. Subjective- Denies having any SI/HI/AVH at this time. Rates pain 8/10; generalized.Is cooperative and remains safe on the unit.  1:1 interaction in private to establish rapport. Encouragement, education, & support given from staff.  PRN ibuprofen and vistaril requested and will re-eval accordingly.   Safety maintained with Q 15 checks. Continue with POC.

## 2017-09-11 NOTE — Tx Team (Signed)
Interdisciplinary Treatment and Diagnostic Plan Update: late entry note for treatment team meeting on 09/10/17.  09/11/2017 Time of Session: 0925 Charles Page MRN: [id number]  Principal Diagnosis: Schizophrenia [facility])  Secondary Diagnoses: Principal Problem:   Schizophrenia ([place])   Current Medications:  Current Facility-Administered Medications  Medication Dose Route Frequency Provider Last Rate Last Dose  . acetaminophen (TYLENOL) tablet 650 mg  650 mg Oral Q6H PRN Laverle Hobby, PA-C   650 mg at 09/11/17 0324  . alum & mag hydroxide-simeth (MAALOX/MYLANTA) 200-200-20 MG/5ML suspension 30 mL  30 mL Oral Q4H PRN Patriciaann Clan E, PA-C      . hydrOXYzine (ATARAX/VISTARIL) tablet 25 mg  25 mg Oral Q6H PRN Patriciaann Clan E, PA-C   25 mg at 09/10/17 2106  . ibuprofen (ADVIL,MOTRIN) tablet 600 mg  600 mg Oral Q6H PRN Laverle Hobby, PA-C   600 mg at 09/10/17 2037  . risperiDONE (RISPERDAL M-TABS) disintegrating tablet 2 mg  2 mg Oral Q8H PRN Laverle Hobby, PA-C       And  . LORazepam (ATIVAN) tablet 1 mg  1 mg Oral PRN Patriciaann Clan E, PA-C       And  . ziprasidone (GEODON) injection 20 mg  20 mg Intramuscular PRN Patriciaann Clan E, PA-C      . magnesium hydroxide (MILK OF MAGNESIA) suspension 30 mL  30 mL Oral Daily PRN Patriciaann Clan E, PA-C      . nicotine (NICODERM CQ - dosed in mg/24 hours) patch 21 mg  21 mg Transdermal Daily Pennelope Bracken, MD   21 mg at 09/10/17 0752  . OLANZapine (ZYPREXA) tablet 10 mg  10 mg Oral QHS Pennelope Bracken, MD   10 mg at 09/10/17 2105  . traZODone (DESYREL) tablet 100 mg  100 mg Oral QHS,MR X 1 Laverle Hobby, PA-C   100 mg at 09/10/17 2105   PTA Medications: Medications Prior to Admission  Medication Sig Dispense Refill Last Dose  . [EXPIRED] ibuprofen (ADVIL,MOTRIN) 600 MG tablet Take 1 tablet (600 mg total) by mouth every 6 (six) hours as needed for up to 3 days. 12 tablet 0 09/06/2017 at prn  . OLANZapine (ZYPREXA) 7.5 MG  tablet Take 1 tablet (7.5 mg total) by mouth daily for 3 days. 3 tablet 0   . OLANZapine (ZYPREXA) 7.5 MG tablet Take 1 tablet (7.5 mg total) by mouth daily. 30 tablet 0   . oxyCODONE-acetaminophen (PERCOCET/ROXICET) 5-325 MG tablet Take 1-2 tablets by mouth every 4 (four) hours as needed for severe pain. (Patient not taking: Reported on 09/07/2017) 15 tablet 0 Not Taking at Unknown time    Patient Stressors: Financial difficulties Medication change or noncompliance  Patient Strengths: Curator fund of knowledge Motivation for treatment/growth Physical Health Work skills  Treatment Modalities: Medication Management, Group therapy, Case management,  1 to 1 session with clinician, Psychoeducation, Recreational therapy.   Physician Treatment Plan for Primary Diagnosis: Schizophrenia ([place]) Long Term Goal(s): Improvement in symptoms so as ready for discharge Improvement in symptoms so as ready for discharge   Short Term Goals: Ability to identify and develop effective coping behaviors will improve Compliance with prescribed medications will improve  Medication Management: Evaluate patient's response, side effects, and tolerance of medication regimen.  Therapeutic Interventions: 1 to 1 sessions, Unit Group sessions and Medication administration.  Evaluation of Outcomes: Not Met  Physician Treatment Plan for Secondary Diagnosis: Principal Problem:   Schizophrenia ([place])  Long Term Goal(s): Improvement in symptoms  so as ready for discharge Improvement in symptoms so as ready for discharge   Short Term Goals: Ability to identify and develop effective coping behaviors will improve Compliance with prescribed medications will improve     Medication Management: Evaluate patient's response, side effects, and tolerance of medication regimen.  Therapeutic Interventions: 1 to 1 sessions, Unit Group sessions and Medication administration.  Evaluation of Outcomes: Not  Met   RN Treatment Plan for Primary Diagnosis: Schizophrenia ([place]) Long Term Goal(s): Knowledge of disease and therapeutic regimen to maintain health will improve  Short Term Goals: Ability to identify and develop effective coping behaviors will improve and Compliance with prescribed medications will improve  Medication Management: RN will administer medications as ordered by provider, will assess and evaluate patient's response and provide education to patient for prescribed medication. RN will report any adverse and/or side effects to prescribing provider.  Therapeutic Interventions: 1 on 1 counseling sessions, Psychoeducation, Medication administration, Evaluate responses to treatment, Monitor vital signs and CBGs as ordered, Perform/monitor CIWA, COWS, AIMS and Fall Risk screenings as ordered, Perform wound care treatments as ordered.  Evaluation of Outcomes: Not Met   LCSW Treatment Plan for Primary Diagnosis: Schizophrenia ([place]) Long Term Goal(s): Safe transition to appropriate next level of care at discharge, Engage patient in therapeutic group addressing interpersonal concerns.  Short Term Goals: Engage patient in aftercare planning with referrals and resources, Increase social support and Increase skills for wellness and recovery  Therapeutic Interventions: Assess for all discharge needs, 1 to 1 time with Social worker, Explore available resources and support systems, Assess for adequacy in community support network, Educate family and significant other(s) on suicide prevention, Complete Psychosocial Assessment, Interpersonal group therapy.  Evaluation of Outcomes: Not Met   Progress in Treatment: Attending groups: Yes. Participating in groups: Yes. Taking medication as prescribed: Yes. Toleration medication: Yes. Family/Significant other contact made: No, will contact:  friend Patient understands diagnosis: Yes. Discussing patient identified problems/goals with staff:  Yes. Medical problems stabilized or resolved: Yes. Denies suicidal/homicidal ideation: Yes. Issues/concerns per patient self-inventory: No. Other: none  New problem(s) identified: No, Describe:  none  New Short Term/Long Term Goal(s):Pt goal: to get back on my meds and be able to work.  Discharge Plan or Barriers:   Reason for Continuation of Hospitalization: Depression Hallucinations Medication stabilization  Estimated Length of Stay: 3-5 days.  Attendees: Patient:Charles Page 09/10/17  Physician: Dr Nancy Fetter, MD 09/10/17  Nursing: Elesa Massed, RN 09/10/17  RN Care Manager:   Social Worker: Lurline Idol, LCSW 09/10/17  Recreational Therapist:    Other: Stephanie Acre, student 09/10/17  Other:    Other:     Scribe for Treatment Team: Joanne Chars, [place] 09/11/2017 7:43 AM

## 2017-09-11 NOTE — Progress Notes (Signed)
[facility] MD Progress Note  09/11/2017 2:44 PM Charles Page  MRN:  [id number]  Subjective: Charles Page reports, "I'm doing well. I'm here because I neglected to take my medicines for a month, then became panicky & my nerves were torn up. I have started my medicines again. I'm doing better than when I came in to the hospital. I'm still hearing the voices sometimes, but none today. I'm attending group. I'm just walking around on the hall to finish my coffee. I'm sleeping well & eating good".  Objective: Charles Page is a 57 y/o M with history of schizophrenia and illicit substance use of cocaine and cannabis who was [facility] from [facility] with worsening symptoms of AH, paranoia, depression, and anxiety. Pt had reported that he had been off of his outpatient psychotropic medications for the past month, and due to stressor of homelessness and recent substance use, he began to have increasing depression, AH, and paranoia. Today, 09-11-17, Charles Page is seen, chart reviewed. The chart findings discussed with the treatment team. He presents alert, oriented & aware of situation. He is making good eye contact, verbally responsive. He is visible on the unit, paticipating in the group sessions. He reports doing well today compare to how he was on admission. He says he stopped taking his mental health medications for about month & his symptoms worsened. He says he is doing better today as he has re-started his treatment regimen. He denies any side effects. He continues to endorse auditory hallucinations since admission except today. He says when he hears the voices, they are bunch of chattering going on, unable to make out what they were saying. He denies any new issues or concerns. He denies any SIHI, VH, delusional thoughts or paranoia. He does not appear to be responding to any internal stimuli.  Principal Problem: Schizophrenia ([facility])  Diagnosis:   Patient Active Problem List   Diagnosis Date Noted  . Schizophrenia  (HCC) [F20.9] 09/10/2017   Total Time spent with patient: 25 minutes  Past Psychiatric History: See H&P  Past Medical History:  Past Medical History:  Diagnosis Date  . Hallucination   . Schizophrenia ([facility])    History reviewed. No pertinent surgical history.  Family History: History reviewed. No pertinent family history.  Family Psychiatric  History: See H&P.  Social History:  Social History   Substance and Sexual Activity  Alcohol Use No  . Frequency: Never     Social History   Substance and Sexual Activity  Drug Use Yes  . Types: Cocaine, Marijuana   Comment: used cocaine 2 days ago    Social History   Socioeconomic History  . Marital status: Single    Spouse name: None  . Number of children: None  . Years of education: None  . Highest education level: None  Social Needs  . Financial resource strain: None  . Food insecurity - worry: None  . Food insecurity - inability: None  . Transportation needs - medical: None  . Transportation needs - non-medical: None  Occupational History  . None  Tobacco Use  . Smoking status: Current Every Day Smoker    Packs/day: 0.50    Types: Cigarettes  . Smokeless tobacco: Never Used  Substance and Sexual Activity  . Alcohol use: No    Frequency: Never  . Drug use: Yes    Types: Cocaine, Marijuana    Comment: used cocaine 2 days ago  . Sexual activity: None  Other Topics Concern  . None  Social History Narrative  .  None   Additional Social History:   Sleep: Good  Appetite:  Good  Current Medications: Current Facility-Administered Medications  Medication Dose Route Frequency Provider Last Rate Last Dose  . acetaminophen (TYLENOL) tablet 650 mg  650 mg Oral Q6H PRN Kerry Hough, PA-C   650 mg at 09/11/17 0955  . alum & mag hydroxide-simeth (MAALOX/MYLANTA) 200-200-20 MG/5ML suspension 30 mL  30 mL Oral Q4H PRN Donell Sievert E, PA-C      . hydrOXYzine (ATARAX/VISTARIL) tablet 25 mg  25 mg Oral Q6H PRN Donell Sievert E, PA-C   25 mg at 09/10/17 2106  . ibuprofen (ADVIL,MOTRIN) tablet 600 mg  600 mg Oral Q6H PRN Kerry Hough, PA-C   600 mg at 09/10/17 2037  . risperiDONE (RISPERDAL M-TABS) disintegrating tablet 2 mg  2 mg Oral Q8H PRN Kerry Hough, PA-C       And  . LORazepam (ATIVAN) tablet 1 mg  1 mg Oral PRN Donell Sievert E, PA-C       And  . ziprasidone (GEODON) injection 20 mg  20 mg Intramuscular PRN Donell Sievert E, PA-C      . magnesium hydroxide (MILK OF MAGNESIA) suspension 30 mL  30 mL Oral Daily PRN Donell Sievert E, PA-C      . nicotine (NICODERM CQ - dosed in mg/24 hours) patch 21 mg  21 mg Transdermal Daily Micheal Likens, MD   21 mg at 09/10/17 0752  . OLANZapine (ZYPREXA) tablet 10 mg  10 mg Oral QHS Micheal Likens, MD   10 mg at 09/10/17 2105  . traZODone (DESYREL) tablet 100 mg  100 mg Oral QHS,MR X 1 Kerry Hough, PA-C   100 mg at 09/10/17 2105   Lab Results:  Results for orders placed or performed during the hospital encounter of 09/09/17 (from the past 48 hour(s))  Lipid panel     Status: Abnormal   Collection Time: 09/11/17  7:27 AM  Result Value Ref Range   Cholesterol 157 0 - 200 mg/dL   Triglycerides 58 <956 mg/dL   HDL 45 >21 mg/dL   Total CHOL/HDL Ratio 3.5 RATIO   VLDL 12 0 - 40 mg/dL   LDL Cholesterol 308 (H) 0 - 99 mg/dL    Comment:        Total Cholesterol/HDL:CHD Risk Coronary Heart Disease Risk Table                     Men   Women  1/2 Average Risk   3.4   3.3  Average Risk       5.0   4.4  2 X Average Risk   9.6   7.1  3 X Average Risk  23.4   11.0        Use the calculated Patient Ratio above and the CHD Risk Table to determine the patient's CHD Risk.        ATP III CLASSIFICATION (LDL):  <100     mg/dL   Optimal  657-846  mg/dL   Near or Above                    Optimal  130-159  mg/dL   Borderline  962-952  mg/dL   High  >841     mg/dL   Very High Performed at [facility], [address] [place], [place] [address]   Hemoglobin A1c     Status: None  Collection Time: 09/11/17  7:27 AM  Result Value Ref Range   Hgb A1c MFr Bld 5.5 4.8 - 5.6 %    Comment: (NOTE) Pre diabetes:          5.7%-6.4% Diabetes:              >6.4% Glycemic control for   <7.0% adults with diabetes    Mean Plasma Glucose 111.15 mg/dL    Comment: Performed at [facility], [address] [place], [place] [address]  TSH     Status: Abnormal   Collection Time: 09/11/17  7:27 AM  Result Value Ref Range   TSH 4.556 (H) 0.350 - 4.500 uIU/mL    Comment: Performed by a 3rd Generation assay with a functional sensitivity of <=0.01 uIU/mL. Performed at [facility], [address], [place], [place] [address]    Blood Alcohol level:  Lab Results  Component Value Date   ETH <10 09/06/2017   Metabolic Disorder Labs: Lab Results  Component Value Date   HGBA1C 5.5 09/11/2017   MPG 111.15 09/11/2017   No results found for: PROLACTIN Lab Results  Component Value Date   CHOL 157 09/11/2017   TRIG 58 09/11/2017   HDL 45 09/11/2017   CHOLHDL 3.5 09/11/2017   VLDL 12 09/11/2017   LDLCALC 100 (H) 09/11/2017   Physical Findings: AIMS: Facial and Oral Movements Muscles of Facial Expression: None, normal Lips and Perioral Area: None, normal Jaw: None, normal Tongue: None, normal,Extremity Movements Upper (arms, wrists, hands, fingers): None, normal Lower (legs, knees, ankles, toes): None, normal, Trunk Movements Neck, shoulders, hips: None, normal, Overall Severity Severity of abnormal movements (highest score from questions above): None, normal Incapacitation due to abnormal movements: None, normal Patient's awareness of abnormal movements (rate only patient's report): No Awareness, Dental Status Current problems with teeth and/or dentures?: No Does patient usually wear dentures?: No  CIWA:  CIWA-Ar Total: 1 COWS:  COWS Total Score: 1  Musculoskeletal: Strength &  Muscle Tone: within normal limits Gait & Station: normal Patient leans: N/A  Psychiatric Specialty Exam: Physical Exam  Nursing note and vitals reviewed.   Review of Systems  Psychiatric/Behavioral: Positive for depression ("Improving") and substance abuse (Hx. Cocaine & Opioid use disorder). Negative for suicidal ideas.    Blood pressure 121/79, pulse 60, temperature 98 F (36.7 C), temperature source Oral, resp. rate 20, height 5' 8.75" (1.746 m), weight 65.4 kg (144 lb 4 oz).Body mass index is 21.46 kg/m.  General Appearance: Casual  Eye Contact:  Good  Speech:  Clear and Coherent and Normal Rate  Volume:  Normal  Mood:  Anxious and Depressed  Affect:  Appropriate, Congruent and Constricted  Thought Process:  Coherent and Goal Directed  Orientation:  Full (Time, Place, and Person)  Thought Content:  Hallucinations: Auditory, Ideas of Reference:   Paranoia Delusions and Paranoid Ideation  Suicidal Thoughts:  No  Homicidal Thoughts:  No  Memory:  Immediate;   Fair Recent;   Fair Remote;   Fair  Judgement:  Poor  Insight:  Lacking  Psychomotor Activity:  Normal  Concentration:  Concentration: Fair  Recall:  [employer] of Knowledge:  Fair  Language:  Fair  Akathisia:  No  Handed:    AIMS (if indicated):     Assets:  Manufacturing systems engineer Physical Health Resilience Social Support  ADL's:  Intact  Cognition:  WNL  Sleep:  Number of Hours: 5.25     Treatment Plan Summary: Daily contact  with patient to assess and evaluate symptoms and progress in treatment: Patient reports some improved symptoms. His symptoms seem to be responding to his treatment regimen. We will continue the inpatient hospitalization as patient continues to improve.   - Will continue today 09/11/2017 plan as below except where it is noted.  Mood control.   - Continue Olanzapine 10 mg po Q hs.  Anxiety.   - Hydroxyzine 25 mg po Q 6 hours prn.  Nicotine withdrawal.   - Nicotine patch 21 mg per  transdermal Q 24 hours prn.  Agitation protocol.   - Continue Risperdal M-tabs disintegrating tablets 2 mg Q 8 hours prn.   - Continue Lorazepam 1 mg po Q 8 hours prn.   - Geodon injection 20 mg IM Q 8 hours prn.  Insomnia.   - Trazodone 100 mg po prn Q hs, may repeat x 1.  - Patient to continue to attend & participate in the group sessions. - Discharge disposition plan ongoing.   Armandina Stammer, NP, PMHNP, FNP-BC. 09/11/2017, 2:44 PM

## 2017-09-11 NOTE — Progress Notes (Signed)
Patient denies SI, HI and AVH.  Patient attended groups and engaged in all unit activities.  Patient had no issues of behavioral dsycontrol.  Assess patient for safety, offer medications as prescribed, engage patient in 1:1 therapeutic staff talks, monitor patient for safety.   Patient was able to contract for safety. Continue to monitor as planned.  

## 2017-09-11 NOTE — Progress Notes (Signed)
Recreation Therapy Notes  Date: 09/11/17 Time: 1015 Location: 500 Hall Dayroom  Group Topic: Leisure Education  Goal Area(s) Addresses:  Patient will identify positive leisure activities.  Patient will identify one positive benefit of participation in leisure activities.   Behavioral Response: Engaged  Intervention: Dry erase marker, white board  Activity: Pictionary.  The group was divided into two teams.  One person from the team would come to the board and draw the picture.  LRT would tell the person what to draw.  Each team would get one minute to guess the picture.  If the team did not guess the picture, the opposing team would get a chance to steal.  Education:  Leisure Education, Discharge Planning  Education Outcome: Acknowledges education/In group clarification offered/Needs additional education  Clinical Observations/Feedback: Pt arrived a little late.  Pt joined right in with his team and fully participated.  Pt did make guesses for the other team when he wasn't supposed to.  Pt was pleasant and seemed to be enjoying himself.    Caroll RancherMarjette Ricky Doan, LRT/CTRS      Caroll RancherLindsay, Jacklynn Dehaas A 09/11/2017 12:13 PM

## 2017-09-11 NOTE — [facility] Suicide Risk Assessment (Signed)
[facility] INPATIENT:  Family/Significant Other Suicide Prevention Education  Suicide Prevention Education:  Education Completed; Marguerita MerlesRobert Schooch, friend, [phone], has been identified by the patient as the family member/significant other with whom the patient will be residing, and identified as the person(s) who will aid the patient in the event of a mental health crisis (suicidal ideations/suicide attempt).  With written consent from the patient, the family member/significant other has been provided the following suicide prevention education, prior to the and/or following the discharge of the patient.  The suicide prevention education provided includes the following:  Suicide risk factors  Suicide prevention and interventions  National Suicide Hotline telephone number  [facility] assessment telephone number  [facility] Emergency Assistance 911  [facility] and/or Residential Mobile Crisis Unit telephone number  Request made of family/significant other to:  Remove weapons (e.g., guns, rifles, knives), all items previously/currently identified as safety concern. No guns involved, per Molly Maduroobert. "He's scared to death of guns."  Remove drugs/medications (over-the-counter, prescriptions, illicit drugs), all items previously/currently identified as a safety concern.  The family member/significant other verbalizes understanding of the suicide prevention education information provided.  The family member/significant other agrees to remove the items of safety concern listed above.  Molly MaduroRobert was quite talkative.  Appears Molly MaduroRobert knows pt professionally through legal aid and the homeless shelter but now sees him more personally as well.  Molly MaduroRobert very concerned that pt has an "exposed mandible" from a previous fractured jaw that could be infected.  Molly MaduroRobert also reports he has a shoulder issue that needs attention.  Pt lost his apartment when it was refurbashed and the rent doubled--now  living in garage owned by his employer.  Molly MaduroRobert reports pt is very capable when on meds and he will continue to support him.  Pt does have a good job.  Lorri FrederickWierda, [place]rest Brod Jon, LCSW 09/11/2017, 2:06 PM

## 2017-09-12 LAB — PROLACTIN: PROLACTIN: 24.9 ng/mL — AB (ref 4.0–15.2)

## 2017-09-12 MED ORDER — TRAZODONE HCL 100 MG PO TABS: ORAL_TABLET | ORAL | 0 refills | Status: AC

## 2017-09-12 MED ORDER — NICOTINE 21 MG/24HR TD PT24: 21.0000 mg | MEDICATED_PATCH | Freq: Every day | TRANSDERMAL | 0 refills | Status: AC

## 2017-09-12 MED ORDER — HYDROXYZINE HCL 25 MG PO TABS: 25.0000 mg | ORAL_TABLET | Freq: Four times a day (QID) | ORAL | 0 refills | Status: AC | PRN

## 2017-09-12 MED ORDER — OLANZAPINE 10 MG PO TABS: 10.0000 mg | ORAL_TABLET | Freq: Every day | ORAL | 0 refills | Status: AC

## 2017-09-12 NOTE — Progress Notes (Signed)
  [facility] Dis[facility] :  Will you be returning to the same living situation after discharge:  Yes,  with employer At discharge, do you have transportation home?: No[place] Do you have the ability to pay for your medications: Yes,  medicare/medicaid  Release of information consent forms completed and in the chart;  Patient's signature needed at discharge.  Patient to Follow up at: Follow-up Information    [facility]. Go on 09/17/2017.   Why:  Please attend your intake appointment with [name] on Wednesday, 09/17/17, at 8:00am. Contact information: [address] [place] [address] [phone]           Next level of care provider has access to [facility] Link:no  Safety Planning and Suicide Prevention discussed: Yes,  with friend  Have you used any form of tobacco in the last 30 days? (Cigarettes, Smokeless Tobacco, Cigars, and/or Pipes): Yes  Has patient been referred to the Quitline?: Patient refused referral  Patient has been referred for addiction treatment: Yes  Charles FrederickWierda, Charles Page Jon, LCSW 09/12/2017, 9:26 AM

## 2017-09-12 NOTE — Discharge Summary (Signed)
Physician Discharge Summary Note  Patient:  Charles Page is an 57 y.o., male MRN:  [id number] DOB:  05/22/1961 Patient phone:  [phone] (home)  Patient address:   [address] [place] [address],  Total Time spent with patient: Greater than 30 minutes  Date of Admission:  09/09/2017 Date of Discharge: 09-12-17  Reason for Admission: Worsening psychosis.  Principal Problem: Schizophrenia [facility](HCC)  Discharge Diagnoses: Patient Active Problem List   Diagnosis Date Noted  . Schizophrenia (HCC) [F20.9] 09/10/2017   Past Psychiatric History: See H&P  Past Medical History:  Past Medical History:  Diagnosis Date  . Hallucination   . Schizophrenia (HCC)    History reviewed. No pertinent surgical history.  Family History: History reviewed. No pertinent family history.  Family Psychiatric  History: See H&P  Social History:  Social History   Substance and Sexual Activity  Alcohol Use No  . Frequency: Never     Social History   Substance and Sexual Activity  Drug Use Yes  . Types: Cocaine, Marijuana   Comment: used cocaine 2 days ago    Social History   Socioeconomic History  . Marital status: Single    Spouse name: None  . Number of children: None  . Years of education: None  . Highest education level: None  Social Needs  . Financial resource strain: None  . Food insecurity - worry: None  . Food insecurity - inability: None  . Transportation needs - medical: None  . Transportation needs - non-medical: None  Occupational History  . None  Tobacco Use  . Smoking status: Current Every Day Smoker    Packs/day: 0.50    Types: Cigarettes  . Smokeless tobacco: Never Used  Substance and Sexual Activity  . Alcohol use: No    Frequency: Never  . Drug use: Yes    Types: Cocaine, Marijuana    Comment: used cocaine 2 days ago  . Sexual activity: None  Other Topics Concern  . None  Social History Narrative  . None   Hospital Course: (Per Md's SRA): Charles Lofterry  "TD" Darcel BayleyLeonard is a 57 y/o M with history of schizophrenia and illicit substance use of cocaine and cannabis who [facility] with worsening symptoms of AH, paranoia, depression, and anxiety. Pt had reported that he had been off of his outpatient psychotropic medications for the past month, and due to stressor of homelessness and recent substance use, he began to have increasing depression, AH, and paranoia.Pt was in agreement to be resumed on previous medication of zyprexa, and he had incremental improvement of his presenting symptoms during his stay.  After his admission assessment, Charles Lofterry was started on the  medication regimen for his presenting symptoms. He was medicated & discharged on; Hydroxyzine 25 mg prn for anxiety, Nicotine 21 mg for smoking, Olanzapine 10 mg for mood control & Trazodone 100 mg for insomnia. He was also enrolled & participated in the group counseling sessions being offered & held on this unit. He learned coping skills that should help him after discharge to cope better. He presented no other medical issues that required treatment. He tolerated his treatment regimen without any adverse effects reported  Today upon evaluation, pt shares, "I'm doing okay." He shares that he felt anxious last night due to loud noises from peers on th unit, but overall he is doing well. He denies SI/HI/AH/VH. He endorses some paranoia about feeling like something bad will happen after he leaves the hospital, but is able  to talk through those feelings. He feels that he would be safe to discharge to home today. He is in agreement to continue his current treatment regimen without changes. He was able to engage in safety planning including plan to return to [facility] or contact emergency services if he feels unable to maintain his own safety or the safety of others. Pt had no further questions, comments, or concerns.  Charles Page left [facility] mentally & medically stable. Transportation per the city  bus. [facility] assisted with bus pass.  Physical Findings: AIMS: Facial and Oral Movements Muscles of Facial Expression: None, normal Lips and Perioral Area: None, normal Jaw: None, normal Tongue: None, normal,Extremity Movements Upper (arms, wrists, hands, fingers): None, normal Lower (legs, knees, ankles, toes): None, normal, Trunk Movements Neck, shoulders, hips: None, normal, Overall Severity Severity of abnormal movements (highest score from questions above): None, normal Incapacitation due to abnormal movements: None, normal Patient's awareness of abnormal movements (rate only patient's report): No Awareness, Dental Status Current problems with teeth and/or dentures?: No Does patient usually wear dentures?: No  CIWA:  CIWA-Ar Total: 1 COWS:  COWS Total Score: 1  Musculoskeletal: Strength & Muscle Tone: within normal limits Gait & Station: normal Patient leans: N/A  Psychiatric Specialty Exam: Physical Exam  Constitutional: He appears well-developed.  HENT:  Head: Normocephalic.  Eyes: Pupils are equal, round, and reactive to light.  Neck: Normal range of motion.  Cardiovascular: Normal rate.  Respiratory: Effort normal.  GI: Soft.  Genitourinary:  Genitourinary Comments: Deferred  Musculoskeletal: Normal range of motion.  Neurological: He is alert.  Skin: Skin is warm.    Review of Systems  Constitutional: Negative.   HENT: Negative.   Eyes: Negative.   Respiratory: Negative.   Cardiovascular: Negative.   Gastrointestinal: Negative.   Genitourinary: Negative.   Musculoskeletal: Negative.   Skin: Negative.   Neurological: Negative.   Endo/Heme/Allergies: Negative.   Psychiatric/Behavioral: Positive for depression (Stable), hallucinations (Hx. psychosis) and substance abuse. Negative for memory loss and suicidal ideas (Hx. Cocaine & opioid use disorder). The patient has insomnia (Stable). The patient is not nervous/anxious (Stable).     Blood pressure 117/76, pulse  71, temperature 98.1 F (36.7 C), temperature source Oral, resp. rate 16, height 5' 8.75" (1.746 m), weight 65.4 kg (144 lb 4 oz).Body mass index is 21.46 kg/m.  See H&P   Have you used any form of tobacco in the last 30 days? (Cigarettes, Smokeless Tobacco, Cigars, and/or Pipes): Yes  Has this patient used any form of tobacco in the last 30 days? (Cigarettes, Smokeless Tobacco, Cigars, and/or Pipes):Yes, an FDA-approved tobacco cessation medication was offered at discharge.  Blood Alcohol level:  Lab Results  Component Value Date   ETH <10 09/06/2017   Metabolic Disorder Labs:  Lab Results  Component Value Date   HGBA1C 5.5 09/11/2017   MPG 111.15 09/11/2017   Lab Results  Component Value Date   PROLACTIN 24.9 (H) 09/11/2017   Lab Results  Component Value Date   CHOL 157 09/11/2017   TRIG 58 09/11/2017   HDL 45 09/11/2017   CHOLHDL 3.5 09/11/2017   VLDL 12 09/11/2017   LDLCALC 100 (H) 09/11/2017   See Psychiatric Specialty Exam and Suicide Risk Assessment completed by Attending Physician prior to discharge.  Discharge destination:  Home  Is patient on multiple antipsychotic therapies at discharge:  No   Has Patient had three or more failed trials of antipsychotic monotherapy by history:  No  Recommended Plan for Multiple Antipsychotic Therapies:  NA  Allergies as of 09/12/2017   No Known Allergies     Medication List    STOP taking these medications   ibuprofen 600 MG tablet Commonly known as:  ADVIL,MOTRIN   oxyCODONE-acetaminophen 5-325 MG tablet Commonly known as:  PERCOCET/ROXICET     TAKE these medications     Indication  hydrOXYzine 25 MG tablet Commonly known as:  ATARAX/VISTARIL Take 1 tablet (25 mg total) by mouth every 6 (six) hours as needed for anxiety.  Indication:  Feeling Anxious   nicotine 21 mg/24hr patch Commonly known as:  NICODERM CQ - dosed in mg/24 hours Place 1 patch (21 mg total) onto the skin daily. (May purchase from over the  counter): For smoking cessation Start taking on:  09/13/2017  Indication:  Nicotine Addiction   OLANZapine 10 MG tablet Commonly known as:  ZYPREXA Take 1 tablet (10 mg total) by mouth at bedtime. For mood control What changed:    medication strength  how much to take  when to take this  additional instructions  Another medication with the same name was removed. Continue taking this medication, and follow the directions you see here.  Indication:  Mood control   traZODone 100 MG tablet Commonly known as:  DESYREL Take I tablet (00 mg) by mouth at bedtime: For sleep  Indication:  Trouble Sleeping      Follow-up Information    [facility]. Go on 09/17/2017.   Why:  Please attend your intake appointment with Iona Hansen on Wednesday, 09/17/17, at 8:00am. Contact information: [address] [place] [address] [phone]          Follow-up recommendations: Activity:  As tolerated Diet: As recommended by your primary care doctor. Keep all scheduled follow-up appointments as recommended.   Comments: Patient is instructed prior to discharge to: Take all medications as prescribed by his/her mental healthcare provider. Report any adverse effects and or reactions from the medicines to his/her outpatient provider promptly. Patient has been instructed & cautioned: To not engage in alcohol and or illegal drug use while on prescription medicines. In the event of worsening symptoms, patient is instructed to call the crisis hotline, 911 and or go to the nearest ED for appropriate evaluation and treatment of symptoms. To follow-up with his/her primary care provider for your other medical issues, concerns and or health care needs.   Signed: Armandina Stammer, NP, PMHNP, FNP-BC 09/12/2017, 10:57 AM   Patient seen, Suicide Assessment Completed.  Disposition Plan Reviewed

## 2017-09-12 NOTE — Progress Notes (Signed)
Pt discharged home on a bus pass. Pt was ambulatory, stable and appreciative at that time. All papers and prescriptions were given and valuables returned. Verbal understanding expressed. Denies SI/HI and A/VH. Pt given opportunity to express concerns and ask questions.  

## 2017-09-12 NOTE — Progress Notes (Signed)
Recreation Therapy Notes  Date: 09/12/17 Time: 1010 Location: 500 [address] Dayroom  Group Topic: Music  Goal Area(s) Addresses:   Patient will verbalize benefit of listening to music. Patient will verbalize positive effect of using music post d/c.   Intervention:  Music  Activity:  Music Therapy.  LRT played soothing music in the dayroom, creating a peaceful environment for the patients.   Education: Music, Discharge Planning  Education Outcome: Acknowledges understanding/In group clarification offered/Needs additional education.   Clinical Observations/Feedback: Pt did not attend group.     Caroll RancherMarjette Jequan Shahin, LRT/CTRS         Caroll RancherLindsay, Kerrington Greenhalgh A 09/12/2017 12:23 PM

## 2017-09-12 NOTE — [facility] Suicide Risk Assessment (Signed)
[facility]   Principal Problem: Schizophrenia [facility](HCC) Discharge Diagnoses:  Patient Active Problem List   Diagnosis Date Noted  . Schizophrenia (HCC) [F20.9] 09/10/2017    Total Time spent with patient: 30 minutes  Musculoskeletal: Strength & Muscle Tone: within normal limits Gait & Station: normal Patient leans: N/A  Psychiatric Specialty Exam: Review of Systems  Constitutional: Negative for chills and fever.  Respiratory: Negative for cough and shortness of breath.   Cardiovascular: Negative for chest pain.  Gastrointestinal: Negative for heartburn and nausea.  Psychiatric/Behavioral: Negative for depression, hallucinations and suicidal ideas. The patient is not nervous/anxious.     Blood pressure 117/76, pulse 71, temperature 98.1 F (36.7 C), temperature source Oral, resp. rate 16, height 5' 8.75" (1.746 m), weight 65.4 kg (144 lb 4 oz).Body mass index is 21.46 kg/m.  General Appearance: Casual and Fairly Groomed  Patent attorneyye Contact::  Good  Speech:  Clear and Coherent and Normal Rate  Volume:  Normal  Mood:  Anxious and Euthymic  Affect:  Appropriate, Congruent and Constricted  Thought Process:  Coherent and Goal Directed  Orientation:  Full (Time, Place, and Person)  Thought Content:  Delusions, Ideas of Reference:   Paranoia Delusions and Paranoid Ideation  Suicidal Thoughts:  No  Homicidal Thoughts:  No  Memory:  Immediate;   Fair Recent;   Fair Remote;   Fair  Judgement:  Fair  Insight:  Fair  Psychomotor Activity:  Normal  Concentration:  Fair  Recall:  FiservFair  Fund of Knowledge:Fair  Language: Fair  Akathisia:  No  Handed:    AIMS (if indicated):     Assets:  Manufacturing systems engineerCommunication Skills Physical Health Resilience Social Support  Sleep:  Number of Hours: 6.75  Cognition: WNL  ADL's:  Intact   Mental Status Per Nursing Assessment::   On Admission:  NA  Demographic Factors:  Male, Caucasian, Low socioeconomic status, Living alone and  Unemployed  Loss Factors: Decline in physical health and Financial problems/change in socioeconomic status  Historical Factors: Impulsivity  Risk Reduction Factors:   Positive social support, Positive therapeutic relationship and Positive coping skills or problem solving skills  Continued Clinical Symptoms:  Schizophrenia:   Paranoid or undifferentiated type  Cognitive Features That Contribute To Risk:  None    Suicide Risk:  Minimal: No identifiable suicidal ideation.  Patients presenting with no risk factors but with morbid ruminations; may be classified as minimal risk based on the severity of the depressive symptoms  Follow-up Information    [facility]. Go on 09/17/2017.   Why:  Please attend your intake appointment with [name] on Wednesday, 09/17/17, at 8:00am. Contact information: [address] [place] [address] [phone]         Subjective Data:  Charles Page is a 57 y/o M with history of schizophrenia and illicit substance use of cocaine and cannabis who was [facility] with worsening symptoms of AH, paranoia, depression, and anxiety. Pt had reported that he had been off of his outpatient psychotropic medications for the past month, and due to stressor of homelessness and recent substance use, he began to have increasing depression, AH, and paranoia. Pt was in agreement to be resumed on previous medication of zyprexa, and he had incremental improvement of his presenting symptoms during his stay.  Today upon evaluation, pt shares, "I'm doing okay." He shares that he felt anxious last night due to loud noises from peers on th unit, but overall  he is doing well. He denies SI/HI/AH/VH. He endorses some paranoia about feeling like something bad will happen after he leaves the hospital, but is able to talk through those feelings. He feels that he would be safe to discharge to home today. He is in agreement to  continue his current treatment regimen without changes. He was able to engage in safety planning including plan to return to [facility] or contact emergency services if he feels unable to maintain his own safety or the safety of others. Pt had no further questions, comments, or concerns.   Plan Of Care/Follow-up recommendations:   - Discharge to outpatient level of care  -Schizophrenia              -Continue zyprexa 10mg  po qhs  -Anxiety             - Continue atarax 25mg  po q6h prn anxiety  -Insomnia             - Continue trazodone 100mg  po qhs prn insomnia  Activity:  as tolerated Diet:  normal Tests:  NA Other:  see above for DC plan  Micheal Likens, MD 09/12/2017, 10:23 AM

## 2017-09-12 NOTE — Plan of Care (Signed)
Pt attended leisure education and coping skills recreation therapy sessions.   Caroll RancherMarjette Filippo Puls, LRT/CTRS

## 2017-09-12 NOTE — Progress Notes (Signed)
Recreation Therapy Notes  INPATIENT RECREATION TR PLAN  Patient Details Name: Charles Page MRN: [id number] DOB: [birth year] Today's Date: 09/12/2017  Rec Therapy Plan Is patient appropriate for Therapeutic Recreation?: Yes Treatment times per week: about 3 days  Estimated Length of Stay: 5-7 days TR Treatment/Interventions: Group participation (Comment)  Discharge Criteria Pt will be discharged from therapy if:: Discharged Treatment plan/goals/alternatives discussed and agreed upon by:: Patient/family  Discharge Summary Short term goals set: Pt will identify 3 positive traits/characteristics about themselves at completion of recreation therapy sessions. Short term goals met: Adequate for discharge Progress toward goals comments: Groups attended Which groups?: Leisure education, Coping skills Reason goals not met: None Therapeutic equipment acquired: N/A Reason patient discharged from therapy: Discharge from hospital Pt/family agrees with progress & goals achieved: Yes Date patient discharged from therapy: 09/12/17    Victorino Sparrow, LRT/CTRS  Ria Comment, Douglas Smolinsky A 09/12/2017, 11:14 AM

## 2019-11-18 IMAGING — CT CT ABD-PELV W/ CM
2 of 5 series · 17 of 46 positions shown, 19 images · IV contrast (Omni 300)
Comparison: None.

CLINICAL DATA: 15 feet fall

EXAM:
CT ABDOMEN AND PELVIS WITH CONTRAST
TECHNIQUE: Multidetector CT imaging of the abdomen and pelvis was performed
using the standard protocol following bolus administration of
intravenous contrast.
CONTRAST:  100mL H0D3S1-RQQ IOPAMIDOL (H0D3S1-RQQ) INJECTION 61%

[Series 4: a/p w/ 5mm · axial · 0.73mm/px · z∈[+699,+1074]mm · 14 of 85 slices shown, 16 images]
[im 5/85  soft-tissue]
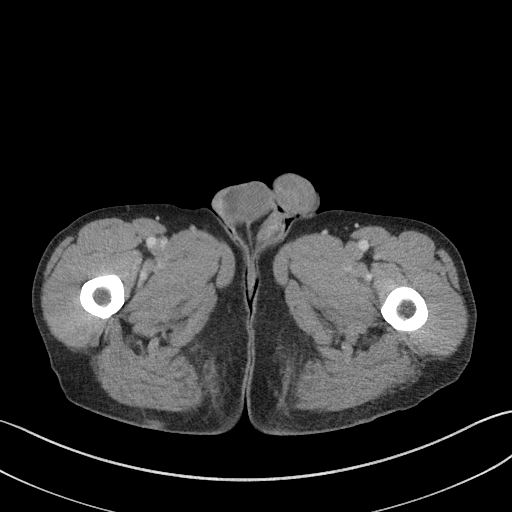
[im 5/85  bone]
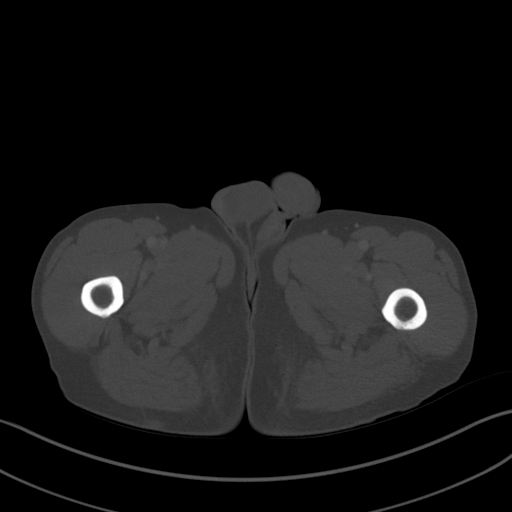
[im 13/85  soft-tissue]
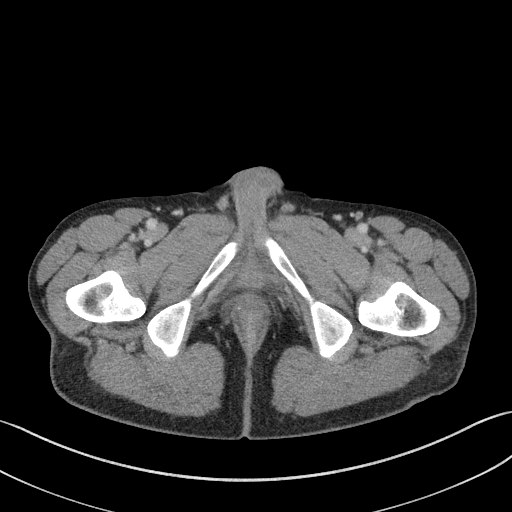
[im 17/85  soft-tissue]
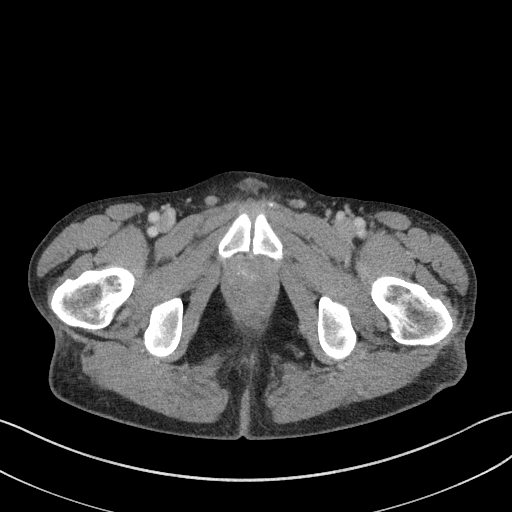
[im 22/85  soft-tissue]
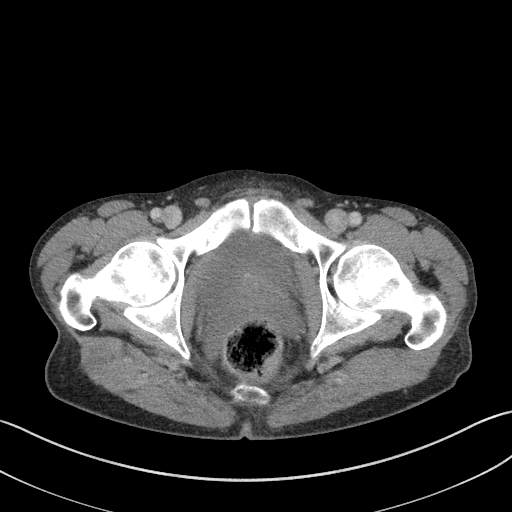
[im 30/85  soft-tissue]
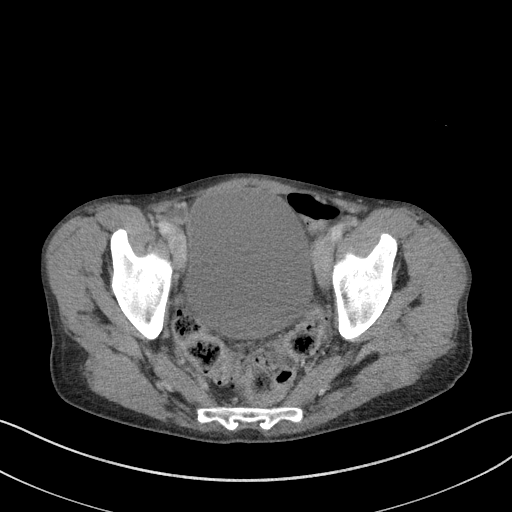
[im 34/85  soft-tissue]
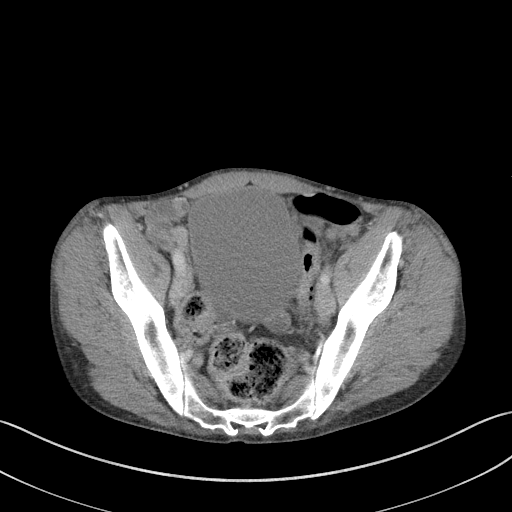
[im 38/85  soft-tissue]
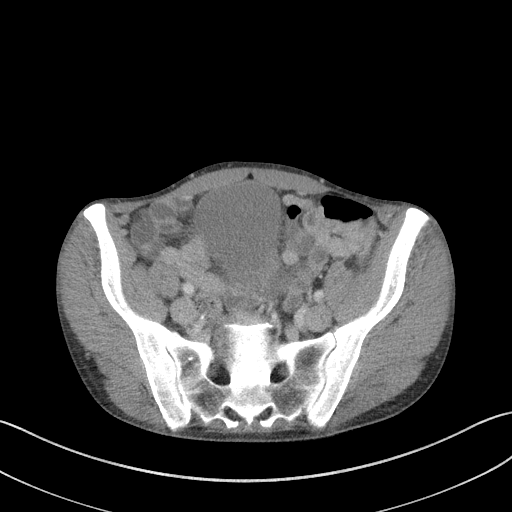
[im 47/85  soft-tissue]
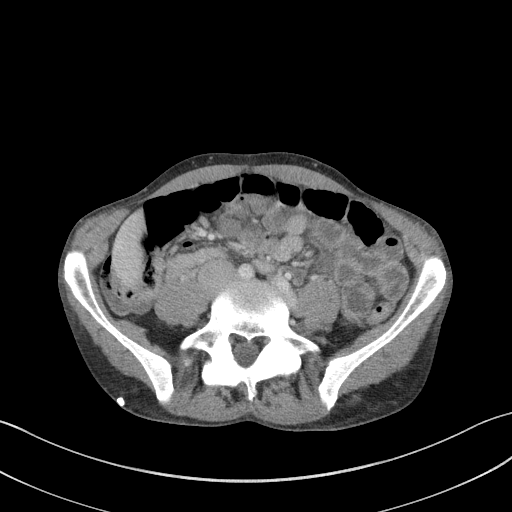
[im 51/85  soft-tissue]
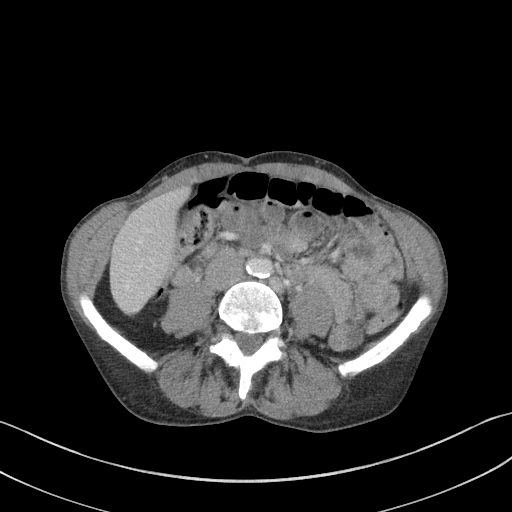
[im 51/85  bone]
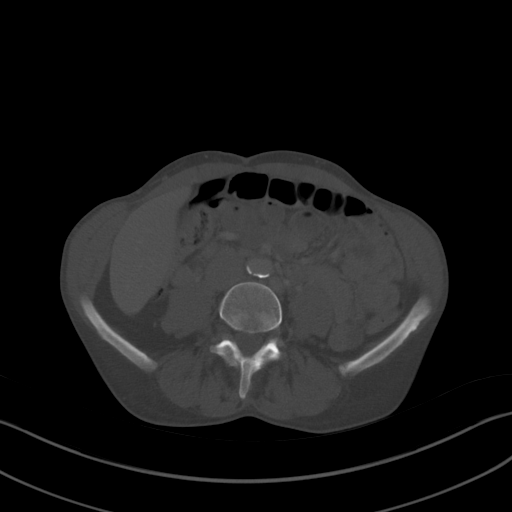
[im 55/85  soft-tissue]
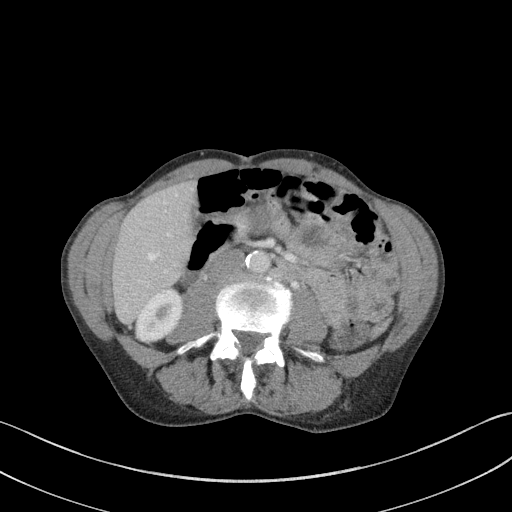
[im 64/85  soft-tissue]
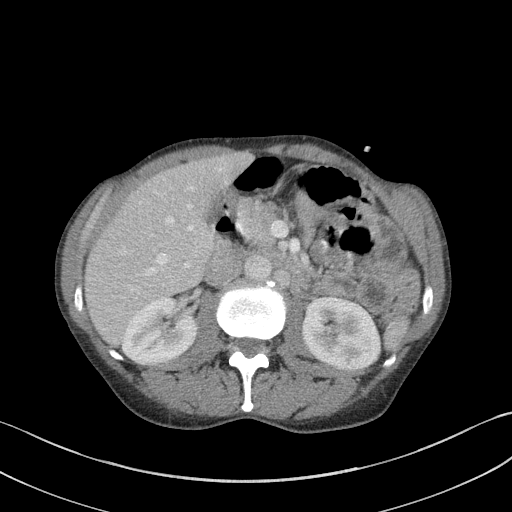
[im 68/85  soft-tissue]
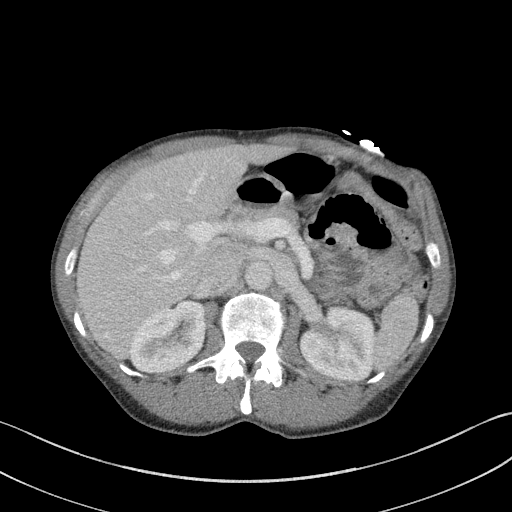
[im 72/85  soft-tissue]
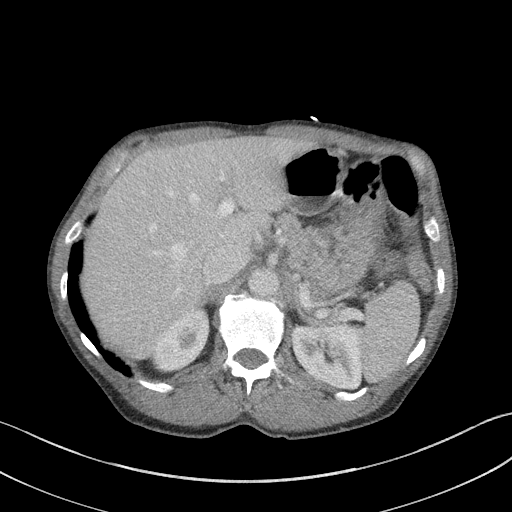
[im 80/85  soft-tissue]
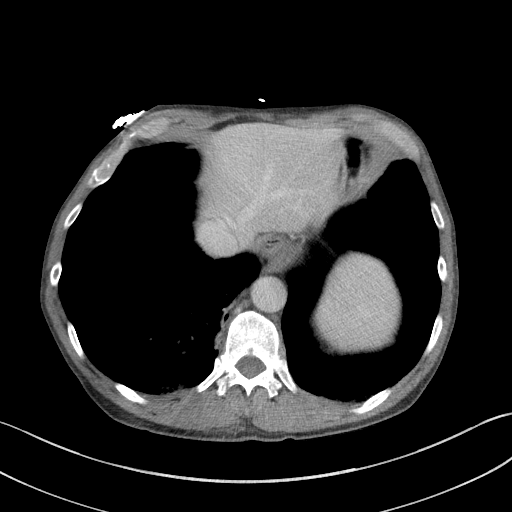

[Series 7: a/p w/ cor · coronal · 0.83mm/px · 3 of 118 slices shown]
[im 40/118  soft-tissue]
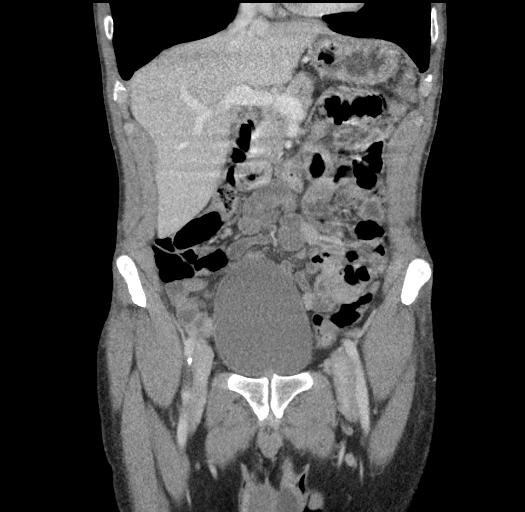
[im 53/118  soft-tissue]
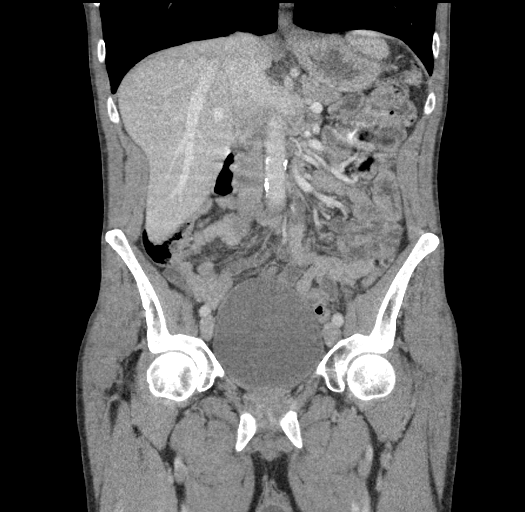
[im 66/118  soft-tissue]
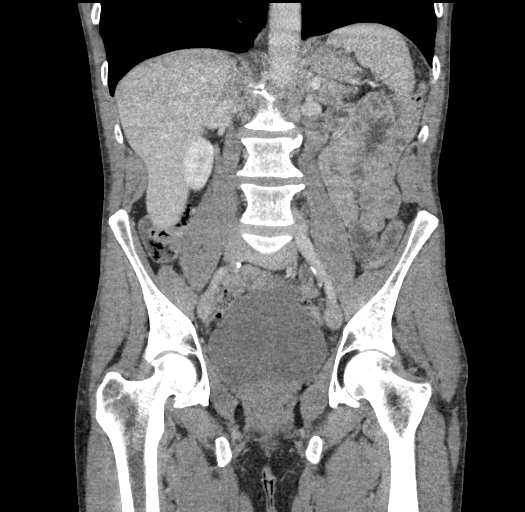

[17 of 46 positions shown; findings below may reference images not displayed]

FINDINGS: Lower chest: Lung bases demonstrate moderate emphysema. Patchy
consolidation at the medial posterior right lung base. No pleural
effusion or pneumothorax at the lung base. Heart size within normal
limits.

Hepatobiliary: No focal hepatic abnormality. No calcified gallstones
or biliary dilatation.

Pancreas: Unremarkable. No pancreatic ductal dilatation or
surrounding inflammatory changes.

Spleen: No splenic injury or perisplenic hematoma.

Adrenals/Urinary Tract: No adrenal hemorrhage or renal injury
identified. Bladder is unremarkable.

Stomach/Bowel: Stomach is within normal limits. Appendix appears
normal. No evidence of bowel wall thickening, distention, or
inflammatory changes.

Vascular/Lymphatic: Nonaneurysmal aorta. Mild to moderate aortic
atherosclerosis. No significantly enlarged lymph nodes.

Reproductive: Slightly enlarged prostate with calcification

Other: Negative for free air or significant free fluid.

Musculoskeletal: Grade 1 anterolisthesis of L5 on S1. Vertebral body
heights are within normal limits. Possible subtle right transverse
process fracture at L3.
IMPRESSION: 1. No CT evidence for acute solid organ injury within the abdomen or
pelvis
2. Patchy consolidation in the medial posterior right lung base may
reflect atelectasis, aspiration or minimal contusion
3. Possible nondisplaced right transverse process fracture at L3
